# Patient Record
Sex: Female | Born: 1975 | Race: White | Hispanic: No | Marital: Married | Smoking: Former smoker
Health system: Southern US, Community
[De-identification: ages and names within clinical notes are randomized; demographics above are authoritative.]

## PROBLEM LIST (undated history)

## (undated) DIAGNOSIS — I341 Nonrheumatic mitral (valve) prolapse: Secondary | ICD-10-CM

## (undated) DIAGNOSIS — D229 Melanocytic nevi, unspecified: Secondary | ICD-10-CM

## (undated) HISTORY — DX: Nonrheumatic mitral (valve) prolapse: I34.1

## (undated) HISTORY — PX: BUNIONECTOMY: SHX129

---
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
## 2005-04-19 ENCOUNTER — Emergency Department (HOSPITAL_COMMUNITY): Admission: EM | Admit: 2005-04-19 | Discharge: 2005-04-19 | Payer: Self-pay | Admitting: Emergency Medicine

## 2006-02-28 ENCOUNTER — Ambulatory Visit: Payer: Self-pay | Admitting: Internal Medicine

## 2006-02-28 LAB — CONVERTED CEMR LAB
BUN: 13 mg/dL (ref 6–23)
Basophils Absolute: 0 10*3/uL (ref 0.0–0.1)
Basophils Relative: 0 % (ref 0–1)
Creatinine, Ser: 0.84 mg/dL (ref 0.40–1.20)
Eosinophils Relative: 1 % (ref 0–5)
Ferritin: 19 ng/mL (ref 10–291)
HCT: 38.9 % (ref 36.0–46.0)
Iron: 75 ug/dL (ref 42–145)
Lymphs Abs: 3.1 10*3/uL (ref 0.7–3.3)
MCHC: 32.9 g/dL (ref 30.0–36.0)
MCV: 87.8 fL (ref 78.0–100.0)
Platelets: 269 10*3/uL (ref 150–400)
Potassium: 4.2 meq/L (ref 3.5–5.3)
RBC count: 4.43 10*6/uL
RDW: 12.2 % (ref 11.5–14.0)
TSH: 1.784 microintl units/mL (ref 0.350–5.50)
Vitamin B-12: 573 pg/mL (ref 211–911)
WBC: 9.1 10*3/uL (ref 4.0–10.5)

## 2006-03-05 ENCOUNTER — Encounter (INDEPENDENT_AMBULATORY_CARE_PROVIDER_SITE_OTHER): Payer: Self-pay | Admitting: Internal Medicine

## 2006-08-31 ENCOUNTER — Ambulatory Visit: Payer: Self-pay | Admitting: Internal Medicine

## 2006-08-31 DIAGNOSIS — N390 Urinary tract infection, site not specified: Secondary | ICD-10-CM | POA: Insufficient documentation

## 2006-08-31 LAB — CONVERTED CEMR LAB
Nitrite: POSITIVE
Protein, U semiquant: NEGATIVE

## 2007-04-05 ENCOUNTER — Ambulatory Visit: Payer: Self-pay | Admitting: Internal Medicine

## 2007-04-05 DIAGNOSIS — J019 Acute sinusitis, unspecified: Secondary | ICD-10-CM | POA: Insufficient documentation

## 2007-11-11 ENCOUNTER — Encounter (INDEPENDENT_AMBULATORY_CARE_PROVIDER_SITE_OTHER): Payer: Self-pay | Admitting: Family Medicine

## 2008-02-21 DIAGNOSIS — I341 Nonrheumatic mitral (valve) prolapse: Secondary | ICD-10-CM

## 2008-02-21 HISTORY — DX: Nonrheumatic mitral (valve) prolapse: I34.1

## 2008-10-20 ENCOUNTER — Encounter (INDEPENDENT_AMBULATORY_CARE_PROVIDER_SITE_OTHER): Payer: Self-pay | Admitting: Internal Medicine

## 2008-10-21 ENCOUNTER — Ambulatory Visit: Payer: Self-pay | Admitting: Internal Medicine

## 2008-10-21 DIAGNOSIS — R5381 Other malaise: Secondary | ICD-10-CM | POA: Insufficient documentation

## 2008-10-21 DIAGNOSIS — R079 Chest pain, unspecified: Secondary | ICD-10-CM | POA: Insufficient documentation

## 2008-10-21 DIAGNOSIS — R5383 Other fatigue: Secondary | ICD-10-CM

## 2008-10-21 DIAGNOSIS — R002 Palpitations: Secondary | ICD-10-CM | POA: Insufficient documentation

## 2008-10-22 ENCOUNTER — Encounter (INDEPENDENT_AMBULATORY_CARE_PROVIDER_SITE_OTHER): Payer: Self-pay | Admitting: Internal Medicine

## 2008-10-22 ENCOUNTER — Ambulatory Visit: Payer: Self-pay | Admitting: Cardiology

## 2008-10-22 ENCOUNTER — Ambulatory Visit (HOSPITAL_COMMUNITY): Admission: RE | Admit: 2008-10-22 | Discharge: 2008-10-22 | Payer: Self-pay | Admitting: Internal Medicine

## 2008-10-22 LAB — CONVERTED CEMR LAB
Basophils Absolute: 0 10*3/uL (ref 0.0–0.1)
Basophils Relative: 0 % (ref 0–1)
Eosinophils Absolute: 0.1 10*3/uL (ref 0.0–0.7)
Eosinophils Relative: 2 % (ref 0–5)
HCT: 39.4 % (ref 36.0–46.0)
Hemoglobin: 13.2 g/dL (ref 12.0–15.0)
MCHC: 33.5 g/dL (ref 30.0–36.0)
Monocytes Absolute: 0.7 10*3/uL (ref 0.1–1.0)
RDW: 12.5 % (ref 11.5–15.5)

## 2008-11-16 ENCOUNTER — Encounter (INDEPENDENT_AMBULATORY_CARE_PROVIDER_SITE_OTHER): Payer: Self-pay | Admitting: Internal Medicine

## 2009-09-26 ENCOUNTER — Emergency Department (HOSPITAL_COMMUNITY): Admission: EM | Admit: 2009-09-26 | Discharge: 2009-09-26 | Payer: Self-pay | Admitting: Emergency Medicine

## 2011-07-12 ENCOUNTER — Encounter: Payer: Self-pay | Admitting: Gynecology

## 2011-07-12 ENCOUNTER — Ambulatory Visit (INDEPENDENT_AMBULATORY_CARE_PROVIDER_SITE_OTHER): Payer: No Typology Code available for payment source

## 2011-07-12 ENCOUNTER — Other Ambulatory Visit (HOSPITAL_COMMUNITY)
Admission: RE | Admit: 2011-07-12 | Discharge: 2011-07-12 | Disposition: A | Discharge status: Home / Self Care | Payer: No Typology Code available for payment source | Source: Ambulatory Visit | Attending: Gynecology | Admitting: Gynecology

## 2011-07-12 ENCOUNTER — Ambulatory Visit (INDEPENDENT_AMBULATORY_CARE_PROVIDER_SITE_OTHER): Payer: No Typology Code available for payment source | Admitting: Gynecology

## 2011-07-12 VITALS — BP 118/70 | Ht 65.0 in | Wt 130.0 lb

## 2011-07-12 DIAGNOSIS — R102 Pelvic and perineal pain: Secondary | ICD-10-CM

## 2011-07-12 DIAGNOSIS — N831 Corpus luteum cyst of ovary, unspecified side: Secondary | ICD-10-CM

## 2011-07-12 DIAGNOSIS — Z01419 Encounter for gynecological examination (general) (routine) without abnormal findings: Secondary | ICD-10-CM

## 2011-07-12 DIAGNOSIS — N949 Unspecified condition associated with female genital organs and menstrual cycle: Secondary | ICD-10-CM

## 2011-07-12 LAB — CBC WITH DIFFERENTIAL/PLATELET
Basophils Absolute: 0 K/uL (ref 0.0–0.1)
Basophils Relative: 0 % (ref 0–1)
Eosinophils Absolute: 0 K/uL (ref 0.0–0.7)
Eosinophils Relative: 1 % (ref 0–5)
HCT: 39.4 % (ref 36.0–46.0)
Hemoglobin: 13.5 g/dL (ref 12.0–15.0)
Lymphocytes Relative: 31 % (ref 12–46)
Lymphs Abs: 2.1 K/uL (ref 0.7–4.0)
MCH: 29.2 pg (ref 26.0–34.0)
MCHC: 34.3 g/dL (ref 30.0–36.0)
MCV: 85.1 fL (ref 78.0–100.0)
Monocytes Absolute: 0.7 K/uL (ref 0.1–1.0)
Monocytes Relative: 11 % (ref 3–12)
Neutro Abs: 3.9 K/uL (ref 1.7–7.7)
Neutrophils Relative %: 57 % (ref 43–77)
Platelets: 282 K/uL (ref 150–400)
RBC: 4.63 MIL/uL (ref 3.87–5.11)
RDW: 13.7 % (ref 11.5–15.5)
WBC: 6.8 K/uL (ref 4.0–10.5)

## 2011-07-12 NOTE — Patient Instructions (Signed)
Breast Self-Exam A self breast exam may help you find changes or problems while they are still small. Do a breast self-exam:  Every month.   One week after your period (menstrual period).   On the first day of each month if you do not have periods anymore.  Look for any:  Change in breast color, size, or shape.   Dimples in your breast.   Changes in your nipples or skin.   Dry skin on your breasts or nipples.   Watery or bloody discharge from your nipples.   Feel for:  Lumps.   Thick, hard places.   Any other changes.  HOME CARE There are 3 ways to do the breast self-exam: In front of a mirror.  Lift your arms over your head and turn side to side.   Put your hands on your hips and lean down, then turn from side to side.   Bend forward and turn from side to side.  In the shower.  With soapy hands, check both breasts. Then check above and below your collarbone and your armpits.   Feel above and below your collarbone down to under your breast, and from the center of your chest to the outer edge of the armpit. Check for any lumps or hard spots.   Using the tips of your middle three fingers check your whole breast by pressing your hand over your breast in a circle or in an up and down motion.  Lying down.  Lie flat on your bed.   Put a small pillow under the breast you are going to check. On that same side, put your hand behind your head.   With your other hand, use the 3 middle fingers to feel the breast.   Move your fingers in a circle around the breast. Press firmly over all parts of the breast to feel for any lumps.  GET HELP RIGHT AWAY IF: You find any changes in your breasts so they can be checked. Document Released: 07/26/2007 Document Revised: 01/26/2011 Document Reviewed: 05/27/2008 Exit[employer] Patient Information 2012 [employer].  Health Maintenance, Females A healthy lifestyle and preventative care can promote health and wellness.  Maintain regular  health, dental, and eye exams.   Eat a healthy diet. Foods like vegetables, fruits, whole grains, low-fat dairy products, and lean protein foods contain the nutrients you need without too many calories. Decrease your intake of foods high in solid fats, added sugars, and salt. Get information about a proper diet from your caregiver, if necessary.   Regular physical exercise is one of the most important things you can do for your health. Most adults should get at least 150 minutes of moderate-intensity exercise (any activity that increases your heart rate and causes you to sweat) each week. In addition, most adults need muscle-strengthening exercises on 2 or more days a week.    Maintain a healthy weight. The body mass index (BMI) is a screening tool to identify possible weight problems. It provides an estimate of body fat based on height and weight. Your caregiver can help determine your BMI, and can help you achieve or maintain a healthy weight. For adults 20 years and older:   A BMI below 18.5 is considered underweight.   A BMI of 18.5 to 24.9 is normal.   A BMI of 25 to 29.9 is considered overweight.   A BMI of 30 and above is considered obese.   Maintain normal blood lipids and cholesterol by exercising and minimizing your   intake of saturated fat. Eat a balanced diet with plenty of fruits and vegetables. Blood tests for lipids and cholesterol should begin at age 20 and be repeated every 5 years. If your lipid or cholesterol levels are high, you are over 50, or you are a high risk for heart disease, you may need your cholesterol levels checked more frequently.Ongoing high lipid and cholesterol levels should be treated with medicines if diet and exercise are not effective.   If you smoke, find out from your caregiver how to quit. If you do not use tobacco, do not start.   If you are pregnant, do not drink alcohol. If you are breastfeeding, be very cautious about drinking alcohol. If you are  not pregnant and choose to drink alcohol, do not exceed 1 drink per day. One drink is considered to be 12 ounces (355 mL) of beer, 5 ounces (148 mL) of wine, or 1.5 ounces (44 mL) of liquor.   Avoid use of street drugs. Do not share needles with anyone. Ask for help if you need support or instructions about stopping the use of drugs.   High blood pressure causes heart disease and increases the risk of stroke. Blood pressure should be checked at least every 1 to 2 years. Ongoing high blood pressure should be treated with medicines, if weight loss and exercise are not effective.   If you are 55 to 38 years old, ask your caregiver if you should take aspirin to prevent strokes.   Diabetes screening involves taking a blood sample to check your fasting blood sugar level. This should be done once every 3 years, after age 45, if you are within normal weight and without risk factors for diabetes. Testing should be considered at a younger age or be carried out more frequently if you are overweight and have at least 1 risk factor for diabetes.   Breast cancer screening is essential preventative care for women. You should practice "breast self-awareness." This means understanding the normal appearance and feel of your breasts and may include breast self-examination. Any changes detected, no matter how small, should be reported to a caregiver. Women in their 20s and 30s should have a clinical breast exam (CBE) by a caregiver as part of a regular health exam every 1 to 3 years. After age 40, women should have a CBE every year. Starting at age 40, women should consider having a mammogram (breast X-ray) every year. Women who have a family history of breast cancer should talk to their caregiver about genetic screening. Women at a high risk of breast cancer should talk to their caregiver about having an MRI and a mammogram every year.   The Pap test is a screening test for cervical cancer. Women should have a Pap test  starting at age 21. Between ages 21 and 29, Pap tests should be repeated every 2 years. Beginning at age 30, you should have a Pap test every 3 years as long as the past 3 Pap tests have been normal. If you had a hysterectomy for a problem that was not cancer or a condition that could lead to cancer, then you no longer need Pap tests. If you are between ages 65 and 70, and you have had normal Pap tests going back 10 years, you no longer need Pap tests. If you have had past treatment for cervical cancer or a condition that could lead to cancer, you need Pap tests and screening for cancer for at least 20 years after   your treatment. If Pap tests have been discontinued, risk factors (such as a new sexual partner) need to be reassessed to determine if screening should be resumed. Some women have medical problems that increase the chance of getting cervical cancer. In these cases, your caregiver may recommend more frequent screening and Pap tests.   The human papillomavirus (HPV) test is an additional test that may be used for cervical cancer screening. The HPV test looks for the virus that can cause the cell changes on the cervix. The cells collected during the Pap test can be tested for HPV. The HPV test could be used to screen women aged 30 years and older, and should be used in women of any age who have unclear Pap test results. After the age of 30, women should have HPV testing at the same frequency as a Pap test.   Colorectal cancer can be detected and often prevented. Most routine colorectal cancer screening begins at the age of 50 and continues through age 75. However, your caregiver may recommend screening at an earlier age if you have risk factors for colon cancer. On a yearly basis, your caregiver may provide home test kits to check for hidden blood in the stool. Use of a small camera at the end of a tube, to directly examine the colon (sigmoidoscopy or colonoscopy), can detect the earliest forms of  colorectal cancer. Talk to your caregiver about this at age 50, when routine screening begins. Direct examination of the colon should be repeated every 5 to 10 years through age 75, unless early forms of pre-cancerous polyps or small growths are found.   Hepatitis C blood testing is recommended for all people born from 1945 through 1965 and any individual with known risks for hepatitis C.   Practice safe sex. Use condoms and avoid high-risk sexual practices to reduce the spread of sexually transmitted infections (STIs). Sexually active women aged 25 and younger should be checked for Chlamydia, which is a common sexually transmitted infection. Older women with new or multiple partners should also be tested for Chlamydia. Testing for other STIs is recommended if you are sexually active and at increased risk.   Osteoporosis is a disease in which the bones lose minerals and strength with aging. This can result in serious bone fractures. The risk of osteoporosis can be identified using a bone density scan. Women ages 65 and over and women at risk for fractures or osteoporosis should discuss screening with their caregivers. Ask your caregiver whether you should be taking a calcium supplement or vitamin D to reduce the rate of osteoporosis.   Menopause can be associated with physical symptoms and risks. Hormone replacement therapy is available to decrease symptoms and risks. You should talk to your caregiver about whether hormone replacement therapy is right for you.   Use sunscreen with a sun protection factor (SPF) of 30 or greater. Apply sunscreen liberally and repeatedly throughout the day. You should seek shade when your shadow is shorter than you. Protect yourself by wearing long sleeves, pants, a wide-brimmed hat, and sunglasses year round, whenever you are outdoors.   Notify your caregiver of new moles or changes in moles, especially if there is a change in shape or color. Also notify your caregiver if  a mole is larger than the size of a pencil eraser.   Stay current with your immunizations.  Document Released: 08/22/2010 Document Revised: 01/26/2011 Document Reviewed: 08/22/2010 [employer] Patient Information 2012 [employer].   

## 2011-07-12 NOTE — Progress Notes (Signed)
Deanna Calderon Feb 04, 1976 [id number]   History:    36 y.o.  for annual exam with complaint of on and off low pelvic discomfort for the past 3-4 months. Patient states discomfort not associated with meals or with her menstrual cycle. She stated she's having normal menstrual cycles and her husband has had a vasectomy. Her last gynecological examination was over 3 years ago in another facility.  Past medical history,surgical history, family history and social history were all reviewed and documented in the EPIC chart.  Gynecologic History Patient's last menstrual period was 06/17/2011. Contraception: vasectomy Last Pap: Over 3 years ago. Results were: normal Last mammogram: No prior study. Results were: normal  Obstetric History OB History    Grav Para Term Preterm Abortions TAB SAB Ect Mult Living   2 2 2       2      # Outc Date GA Lbr Len/2nd Wgt Sex Del Anes PTL Lv   1 TRM     F SVD  No Yes   2 TRM     M SVD  No Yes       ROS: A ROS was performed and pertinent positives and negatives are included in the history.  GENERAL: No fevers or chills. HEENT: No change in vision, no earache, sore throat or sinus congestion. NECK: No pain or stiffness. CARDIOVASCULAR: No chest pain or pressure. No palpitations. PULMONARY: No shortness of breath, cough or wheeze. GASTROINTESTINAL: No abdominal pain, nausea, vomiting or diarrhea, melena or bright red blood per rectum. GENITOURINARY: No urinary frequency, urgency, hesitancy or dysuria. MUSCULOSKELETAL: No joint or muscle pain, no back pain, no recent trauma. DERMATOLOGIC: No rash, no itching, no lesions. ENDOCRINE: No polyuria, polydipsia, no heat or cold intolerance. No recent change in weight. HEMATOLOGICAL: No anemia or easy bruising or bleeding. NEUROLOGIC: No headache, seizures, numbness, tingling or weakness. PSYCHIATRIC: No depression, no loss of interest in normal activity or change in sleep pattern.     Exam: chaperone present  BP 118/70   Ht 5\' 5"  (1.651 m)  Wt 130 lb (58.968 kg)  BMI 21.63 kg/m2  LMP 06/17/2011  Body mass index is 21.63 kg/(m^2).  General appearance : Well developed well nourished female. No acute distress HEENT: Neck supple, trachea midline, no carotid bruits, no thyroidmegaly Lungs: Clear to auscultation, no rhonchi or wheezes, or rib retractions  Heart: Regular rate and rhythm, no murmurs or gallops Breast:Examined in sitting and supine position were symmetrical in appearance, no palpable masses or tenderness,  no skin retraction, no nipple inversion, no nipple discharge, no skin discoloration, no axillary or supraclavicular lymphadenopathy Abdomen: no palpable masses or tenderness, no rebound or guarding Extremities: no edema or skin discoloration or tenderness  Pelvic:  Bartholin, Urethra, Skene Glands: Within normal limits             Vagina: No gross lesions or discharge  Cervix: No gross lesions or discharge  Uterus  anteverted, normal size, shape and consistency, non-tender and mobile  Adnexa  slight tenderness and questionable fullness on the right adnexa  Anus and perineum  normal   Rectovaginal  normal sphincter tone without palpated masses or tenderness             Hemoccult not done   Ultrasound report today: Uterus measures 9.9 x 6.9 x 4.3 cm with an endometrial stripe of 9.7 mm (patient on day 26 of her cycle) right ovary normal left ovary thick corpus luteum cyst measuring 27 x 23 x 21  mm.  Assessment/Plan:  36 y.o. female for annual exam she was encouraged to do her monthly self breast examination. The following labs were ordered today: CBC, cholesterol, urinalysis and Pap smear. New Pap smear screening guidelines discussed. Patient was reassured with the findings on the ultrasound. If she continues to have low abdominal pains or is affecting her quality of life we'll need to consider doing a laparoscopy or referring her to the gastroenterologist.    Ok Edwards MD, 9:32 AM  07/12/2011

## 2011-07-13 LAB — URINALYSIS W MICROSCOPIC + REFLEX CULTURE
Bilirubin Urine: NEGATIVE
Crystals: NONE SEEN
Glucose, UA: NEGATIVE mg/dL
Specific Gravity, Urine: 1.024 (ref 1.005–1.030)

## 2012-07-31 ENCOUNTER — Other Ambulatory Visit: Payer: Self-pay | Admitting: Dermatology

## 2012-07-31 DIAGNOSIS — D229 Melanocytic nevi, unspecified: Secondary | ICD-10-CM

## 2012-07-31 HISTORY — DX: Melanocytic nevi, unspecified: D22.9

## 2012-12-31 ENCOUNTER — Encounter: Payer: Self-pay | Admitting: Family Medicine

## 2012-12-31 ENCOUNTER — Ambulatory Visit (INDEPENDENT_AMBULATORY_CARE_PROVIDER_SITE_OTHER): Payer: 59 | Admitting: Family Medicine

## 2012-12-31 VITALS — BP 110/60 | HR 88 | Temp 98.2°F | Resp 18 | Ht 64.5 in | Wt 134.5 lb

## 2012-12-31 DIAGNOSIS — F17200 Nicotine dependence, unspecified, uncomplicated: Secondary | ICD-10-CM

## 2012-12-31 DIAGNOSIS — F329 Major depressive disorder, single episode, unspecified: Secondary | ICD-10-CM

## 2012-12-31 MED ORDER — BUPROPION HCL ER (SR) 150 MG PO TB12: 150.0000 mg | ORAL_TABLET | Freq: Two times a day (BID) | ORAL | Status: DC

## 2012-12-31 NOTE — Patient Instructions (Signed)
Restart the the wellbutrin, with 1 tablet daily for 1 week, the increase to 1 tablet twice a week  Work on the smoking!  F/U 3 months

## 2013-01-01 ENCOUNTER — Encounter: Payer: Self-pay | Admitting: Family Medicine

## 2013-01-01 DIAGNOSIS — F329 Major depressive disorder, single episode, unspecified: Secondary | ICD-10-CM | POA: Insufficient documentation

## 2013-01-01 DIAGNOSIS — F17200 Nicotine dependence, unspecified, uncomplicated: Secondary | ICD-10-CM | POA: Insufficient documentation

## 2013-01-01 NOTE — Assessment & Plan Note (Signed)
Hopefully the Wellbutrin will help her with smoking again, she is very interested in tobacco cessation

## 2013-01-01 NOTE — Progress Notes (Signed)
  Subjective:    Patient ID: Deanna Calderon, female    DOB: Aug 03, 1975, 37 y.o.   MRN: [id number]  HPI  Patient here to establish care. She's never had a true PCP. She is followed by GYN Dr. Lily Peer in her Pap smear is up-to-date. She was recently on Wellbutrin 150 mg twice a day secondary to depression and stress. She ran out of her medication about one month ago. Her medication was actually prescribed by a physician whom she was working with it she was also a patient of. The Wellbutrin helped keep her stress levels down and her mood was much improved. She also quit smoking with the medication and over the past month has returned to smoking up to 5 cigarettes a day. She denies any crying episodes and difficulty with sleep. She denies any suicidal ideations or history of. She currently works as an Public house manager Family history was reviewed  Review of Systems  GEN- denies fatigue, fever, weight loss,weakness, recent illness HEENT- denies eye drainage, change in vision, nasal discharge, CVS- denies chest pain, palpitations RESP- denies SOB, cough, wheeze ABD- denies N/V, change in stools, abd pain GU- denies dysuria, hematuria, dribbling, incontinence MSK- denies joint pain, muscle aches, injury Neuro- denies headache, dizziness, syncope, seizure activity      Objective:   Physical Exam GEN- NAD, alert and oriented x3 HEENT- PERRL, EOMI, non injected sclera, pink conjunctiva, MMM, oropharynx clear Neck- Supple, no thyromegaly CVS- RRR, no murmur RESP-CTAB EXT- No edema Pulses- Radial 2+ Psych- normal affect and mood         Assessment & Plan:

## 2013-01-01 NOTE — Assessment & Plan Note (Signed)
Restart Wellbutrin she has done well with this. Will start to 150 once a day for one week and then increase back to the twice a day dosing

## 2013-04-08 ENCOUNTER — Ambulatory Visit: Payer: 59 | Admitting: Family Medicine

## 2013-09-03 ENCOUNTER — Ambulatory Visit (INDEPENDENT_AMBULATORY_CARE_PROVIDER_SITE_OTHER): Payer: 59 | Admitting: Family Medicine

## 2013-09-03 ENCOUNTER — Encounter: Payer: Self-pay | Admitting: Family Medicine

## 2013-09-03 VITALS — BP 124/72 | HR 68 | Temp 97.9°F | Resp 12 | Ht 67.0 in | Wt 135.0 lb

## 2013-09-03 DIAGNOSIS — R5381 Other malaise: Secondary | ICD-10-CM

## 2013-09-03 DIAGNOSIS — Z13220 Encounter for screening for lipoid disorders: Secondary | ICD-10-CM

## 2013-09-03 DIAGNOSIS — R5383 Other fatigue: Secondary | ICD-10-CM

## 2013-09-03 DIAGNOSIS — F17200 Nicotine dependence, unspecified, uncomplicated: Secondary | ICD-10-CM

## 2013-09-03 DIAGNOSIS — Z1321 Encounter for screening for nutritional disorder: Secondary | ICD-10-CM

## 2013-09-03 DIAGNOSIS — F330 Major depressive disorder, recurrent, mild: Secondary | ICD-10-CM

## 2013-09-03 DIAGNOSIS — Z13228 Encounter for screening for other metabolic disorders: Secondary | ICD-10-CM

## 2013-09-03 DIAGNOSIS — Z1329 Encounter for screening for other suspected endocrine disorder: Secondary | ICD-10-CM

## 2013-09-03 DIAGNOSIS — Z0000 Encounter for general adult medical examination without abnormal findings: Secondary | ICD-10-CM

## 2013-09-03 DIAGNOSIS — Z130 Encounter for screening for diseases of the blood and blood-forming organs and certain disorders involving the immune mechanism: Secondary | ICD-10-CM

## 2013-09-03 MED ORDER — FEXOFENADINE HCL 180 MG PO TABS: 180.0000 mg | ORAL_TABLET | Freq: Every day | ORAL | Status: DC

## 2013-09-03 MED ORDER — NICOTINE 7 MG/24HR TD PT24: 7.0000 mg | MEDICATED_PATCH | Freq: Every day | TRANSDERMAL | Status: DC

## 2013-09-03 MED ORDER — BUPROPION HCL ER (XL) 300 MG PO TB24: 300.0000 mg | ORAL_TABLET | Freq: Every day | ORAL | Status: DC

## 2013-09-03 NOTE — Assessment & Plan Note (Signed)
We discussed tobacco cessation we will try NicoDerm 7 mg patch concerned about giving her too much nicotine and she does not smoke that much

## 2013-09-03 NOTE — Progress Notes (Signed)
Patient ID: Deanna Calderon, female   DOB: 1975/11/10, 38 y.o.   MRN: [id number]   Subjective:    Patient ID: Deanna Calderon, female    DOB: Apr 14, 1975, 38 y.o.   MRN: [id number]  Patient presents for F/U  patient here to followup medications. She's history Maj. depressive disorder she is taking Wellbutrin but some nights forgets 2 July to go back to the extended release. She's doing well with the medication. She also continues to smoke she is down to 3 or 4 cigarettes a day but wants to use a nicotine patch to help her.  She is due for her GYN exam she would like to go ahead and get her fasting labs done she's also had some increased fatigue therefore requested a thyroid be checked. She is sleeping fairly well. He is a very busy job and she works as a Marine scientist for an [employer]:  GEN- + fatigue, fever, weight loss,weakness, recent illness HEENT- denies eye drainage, change in vision, nasal discharge, CVS- denies chest pain, palpitations RESP- denies SOB, cough, wheeze ABD- denies N/V, change in stools, abd pain GU- denies dysuria, hematuria, dribbling, incontinence MSK- denies joint pain, muscle aches, injury Neuro- denies headache, dizziness, syncope, seizure activity       Objective:    BP 124/72  Pulse 68  Temp(Src) 97.9 F (36.6 C) (Oral)  Resp 12  Ht 5\' 7"  (1.702 m)  Wt 135 lb (61.236 kg)  BMI 21.14 kg/m2  LMP 08/18/2013 GEN- NAD, alert and oriented x3 HEENT- PERRL, EOMI, non injected sclera, pink conjunctiva, MMM, oropharynx clear Neck- Supple, no thyromegaly CVS- RRR, no murmur RESP-CTAB Psych- Normal affect and mood, well groomed, normal speech EXT- No edema Pulses- Radial 2+        Assessment & Plan:      Problem List Items Addressed This Visit   None    Visit Diagnoses   Other malaise and fatigue    -  Primary    Relevant Orders       TSH    Routine general medical examination at a health care facility        Relevant  Orders       CBC with Differential       Comprehensive metabolic panel    Screening cholesterol level        Relevant Orders       Lipid panel    Encounter for vitamin deficiency screening        Relevant Orders       Vitamin D, 25-hydroxy       Note: This dictation was prepared with Dragon dictation along with smaller phrase technology. Any transcriptional errors that result from this process are unintentional.

## 2013-09-03 NOTE — Assessment & Plan Note (Signed)
I will convert her to 300 mg extended release to help with compliance He is doing well with the Wellbutrin

## 2013-09-03 NOTE — Patient Instructions (Signed)
Wellbutrin XL sent in  Try the tobacco patch  Get the labs done fasting F/U 6 months

## 2013-09-03 NOTE — Assessment & Plan Note (Signed)
Think this is multifactorial with her busy job she also has some underlying depression. I will go ahead and check TSH on her as well as vitamin D with her fasting labs

## 2013-09-04 LAB — CBC WITH DIFFERENTIAL/PLATELET
Basophils Absolute: 0 10*3/uL (ref 0.0–0.1)
Basophils Relative: 0 % (ref 0–1)
EOS ABS: 0.1 10*3/uL (ref 0.0–0.7)
Eosinophils Relative: 1 % (ref 0–5)
HCT: 38.8 % (ref 36.0–46.0)
Hemoglobin: 13.5 g/dL (ref 12.0–15.0)
LYMPHS ABS: 2.3 10*3/uL (ref 0.7–4.0)
LYMPHS PCT: 39 % (ref 12–46)
MCH: 29.8 pg (ref 26.0–34.0)
MCHC: 34.8 g/dL (ref 30.0–36.0)
MCV: 85.7 fL (ref 78.0–100.0)
Monocytes Absolute: 0.6 10*3/uL (ref 0.1–1.0)
Monocytes Relative: 10 % (ref 3–12)
NEUTROS PCT: 50 % (ref 43–77)
Neutro Abs: 2.9 10*3/uL (ref 1.7–7.7)
PLATELETS: 262 10*3/uL (ref 150–400)
RBC: 4.53 MIL/uL (ref 3.87–5.11)
RDW: 12.8 % (ref 11.5–15.5)
WBC: 5.8 10*3/uL (ref 4.0–10.5)

## 2013-09-04 LAB — COMPREHENSIVE METABOLIC PANEL
ALT: 9 U/L (ref 0–35)
AST: 14 U/L (ref 0–37)
Albumin: 4 g/dL (ref 3.5–5.2)
Alkaline Phosphatase: 47 U/L (ref 39–117)
BILIRUBIN TOTAL: 0.6 mg/dL (ref 0.2–1.2)
BUN: 10 mg/dL (ref 6–23)
CHLORIDE: 104 meq/L (ref 96–112)
CO2: 27 meq/L (ref 19–32)
Calcium: 8.9 mg/dL (ref 8.4–10.5)
Creat: 0.78 mg/dL (ref 0.50–1.10)
Glucose, Bld: 86 mg/dL (ref 70–99)
Potassium: 4.2 mEq/L (ref 3.5–5.3)
SODIUM: 139 meq/L (ref 135–145)
TOTAL PROTEIN: 6 g/dL (ref 6.0–8.3)

## 2013-09-04 LAB — TSH: TSH: 2.162 u[IU]/mL (ref 0.350–4.500)

## 2013-09-04 LAB — LIPID PANEL
Cholesterol: 155 mg/dL (ref 0–200)
HDL: 56 mg/dL (ref >39)
LDL Cholesterol: 85 mg/dL (ref 0–99)
Total CHOL/HDL Ratio: 2.8 Ratio
Triglycerides: 71 mg/dL (ref <150)
VLDL: 14 mg/dL (ref 0–40)

## 2013-09-04 LAB — VITAMIN D 25 HYDROXY (VIT D DEFICIENCY, FRACTURES): VIT D 25 HYDROXY: 69 ng/mL (ref 30–89)

## 2013-09-12 ENCOUNTER — Ambulatory Visit (INDEPENDENT_AMBULATORY_CARE_PROVIDER_SITE_OTHER): Payer: 59

## 2013-09-12 ENCOUNTER — Ambulatory Visit (INDEPENDENT_AMBULATORY_CARE_PROVIDER_SITE_OTHER): Payer: 59 | Admitting: Podiatrist

## 2013-09-12 ENCOUNTER — Encounter: Payer: Self-pay | Admitting: Podiatrist

## 2013-09-12 VITALS — BP 138/88 | HR 62 | Resp 17

## 2013-09-12 DIAGNOSIS — M2011 Hallux valgus (acquired), right foot: Secondary | ICD-10-CM

## 2013-09-12 DIAGNOSIS — M21619 Bunion of unspecified foot: Secondary | ICD-10-CM

## 2013-09-12 NOTE — Patient Instructions (Signed)
Bunionectomy A bunionectomy is surgery to remove a bunion. A bunion is an enlargement of the joint at the base of the big toe. It is made up of bone and soft tissue on the inside part of the joint. Over time, a painful lump appears on the inside of the joint. The big toe begins to point inward toward the second toe. New bone growth can occur and a bone spur may form. The pain eventually causes difficulty walking. A bunion usually results from inflammation caused by the irritation of poorly fitting shoes. It often begins later in life. A bunionectomy is performed when nonsurgical treatment no longer works. When surgery is needed, the extent of the procedure will depend on the degree of deformity of the foot. Your surgeon will discuss with you the different procedures and what will work best for you depending on your age and health. LET YOUR CAREGIVER KNOW ABOUT:   Previous problems with anesthetics or medicines used to numb the skin.  Allergies to dyes, iodine, foods, and/or latex.  Medicines taken including herbs, eye drops, prescription medicines (especially medicines used to "thin the blood"), aspirin and other over-the-counter medicines, and steroids (by mouth or as a cream).  History of bleeding or blood problems.  Possibility of pregnancy, if this applies.  History of blood clots in your legs and/or lungs .  Previous surgery.  Other important health problems. RISKS AND COMPLICATIONS   Infection.  Pain.  Nerve damage.  Possibility that the bunion will recur. BEFORE THE PROCEDURE  You should be present 60 minutes prior to your procedure or as directed.  PROCEDURE  Surgery is often done so that you can go home the same day (outpatient). It may be done in a hospital or in an outpatient surgical center. An anesthetic will be used to help you sleep during the procedure. Sometimes, a spinal anesthetic is used to make you numb below the waist. A cut (incision) is made over the swollen  area at the first joint of the big toe. The enlarged lump will be removed. If there is a need to reposition the bones of the big toe, this may require more than 1 incision. The bone itself may need to be cut. Screws and wires may be used in the repair. These can be removed at a later date. In severe cases, the entire joint may need to be removed and a joint replacement inserted. When done, the incision is closed with stitches (sutures). Skin adhesive strips may be added for reinforcement. They help hold the incision closed.  AFTER THE PROCEDURE  Compression bandages (dressings) are then wrapped around the wound. This helps to keep the foot in alignment and reduce swelling. Your foot will be monitored for bleeding and swelling. You will need to stay for a few hours in the recovery area before being discharged. This allows time for the anesthesia to wear off. You will be discharged home when you are awake, stable, and doing well. HOME CARE INSTRUCTIONS   You can expect to return to normal activities within 6 to 8 weeks after surgery. The foot is at increased risk for swelling for several months. When you can expect to bear weight on the operated foot will depend on the extent of your surgery. The milder the deformity, the less tissue is removed and the sooner the return to normal activity level. During the recovery period, a special shoe, boot, or cast may be worn to accommodate the surgical bandage and to help provide stability   to the foot.  Once you are home, an ice pack applied to the operative site may help with discomfort and keep swelling down. Stop using the ice if it causes discomfort.  Keep your feet raised (elevated) when possible to lessen swelling.  If you have an elastic bandage on your foot and you have numbness, tingling, or your foot becomes cold and blue, adjust the bandage to make it comfortable.  Change dressings as directed.  Keep the wound dry and clean. The wound may be washed  gently with soap and water. Gently blot dry without rubbing. Do not take baths or use swimming pools or hot tubs for 10 days, or as instructed by your caregiver.  Only take over-the-counter or prescription medicines for pain, discomfort, or fever as directed by your caregiver.  You may continue a normal diet as directed.  For activity, use crutches with no weight bearing or your orthopedic shoe as directed. Continue to use crutches or a cane as directed until you can stand without causing pain. SEEK MEDICAL CARE IF:   You have redness, swelling, bruising, or increasing pain in the wound.  There is pus coming from the wound.  You have drainage from a wound lasting longer than 1 day.  You have an oral temperature above 102 F (38.9 C).  You notice a bad smell coming from the wound or dressing.  The wound breaks open after sutures have been removed.  You develop dizzy episodes or fainting while standing.  You have persistent nausea or vomiting.  Your toes become cold.  Pain is not relieved with medicines. SEEK IMMEDIATE MEDICAL CARE IF:   You develop a rash.  You have difficulty breathing.  You develop any reaction or side effects to medicines given.  Your toes are numb or blue, or you have severe pain. MAKE SURE YOU:   Understand these instructions.  Will watch your condition.  Will get help right away if you are not doing well or get worse. Document Released: 01/20/2005 Document Revised: 05/01/2011 Document Reviewed: 02/25/2007 ExitCare Patient Information 2015 [employer]. This information is not intended to replace advice given to you by your health care provider. Make sure you discuss any questions you have with your health care provider.  

## 2013-09-12 NOTE — Progress Notes (Signed)
   Subjective:    Patient ID: Deanna Calderon, female    DOB: 11-06-1975, 38 y.o.   MRN: [id number]  HPI Pt presents with right foot pain, states that she thinks she has a bunion. States that foot is very painful, worsens with walking, hurting for 2 years but has worsens.   Review of Systems     Objective:   Physical Exam Patient is awake, alert, and oriented x 3.  In no acute distress.  Vascular status is intact with palpable pedal pulses at 2/4 DP and PT bilateral and capillary refill time within normal limits. Neurological sensation is also intact bilaterally via Semmes Weinstein monofilament at 5/5 sites. Light touch, vibratory sensation, Achilles tendon reflex is intact. Dermatological exam reveals skin color, turger and texture as normal. No open lesions present.  Musculature intact with dorsiflexion, plantarflexion, inversion, eversion.  Moderate bunion is present right foot-- it is painful when running as it rubs against her 2nd toe.  On x-ray shortening first metatarsal is noted in comparison with the second an elongated second toe is also seen. Bunion deformity is present with a moderate medial eminence seen on x-ray.    Assessment & Plan:  Bunion right foot  Plan:Discussed conservative versus surgical options. Recommended a austin bunion repair right foot with screw fixation. The consent form was discussed and all three pages were signed and the patient's questions were encouraged and answered to the best of my ability. Risks of the surgery were discussed including but not limited to continued pain, infection, swelling, elevated toe, decreased range of motion,  suture or implant reaction, bleeding, decreased function, etc. Preoperative instructions were also dispensed to the patient as well as a preoperative surgical pamphlet to go along with the instructions. Surgery will be scheduled at the patients convenience and patient will be seen at [facility] specialty surgery center on  outpatient basis.The patient is instructed to call if any questions or concerns arise.

## 2013-10-20 ENCOUNTER — Telehealth: Payer: Self-pay | Admitting: *Deleted

## 2013-10-20 ENCOUNTER — Encounter: Payer: Self-pay | Admitting: Podiatrist

## 2013-10-20 NOTE — Telephone Encounter (Signed)
I was supposed to call back to give a possible date to have surgery with Dr. Valentina Lucks.  Give me a call back.  I returned her call.  She asked for surgery to be done on 02/06/2014.  I told her that date is not good.  Dr. Valentina Lucks does surgery on Wednesdays.  She asked if we could do it on 01/28/2014.  I told her that is fine.  I asked if she had signed consent forms.  She stated no, I have not.  I asked if she wanted me to send her to a scheduler to schedule a consult.  She stated she would call back because she was seeing patients.

## 2013-11-05 ENCOUNTER — Ambulatory Visit: Payer: 59

## 2013-12-01 ENCOUNTER — Encounter: Payer: Self-pay | Admitting: Physician Assistant

## 2013-12-01 ENCOUNTER — Ambulatory Visit (INDEPENDENT_AMBULATORY_CARE_PROVIDER_SITE_OTHER): Payer: 59 | Admitting: Physician Assistant

## 2013-12-01 VITALS — BP 112/66 | HR 68 | Temp 98.1°F | Resp 18 | Wt 138.0 lb

## 2013-12-01 DIAGNOSIS — B9689 Other specified bacterial agents as the cause of diseases classified elsewhere: Principal | ICD-10-CM

## 2013-12-01 DIAGNOSIS — J988 Other specified respiratory disorders: Secondary | ICD-10-CM

## 2013-12-01 MED ORDER — AMOXICILLIN-POT CLAVULANATE 875-125 MG PO TABS: 1.0000 | ORAL_TABLET | Freq: Two times a day (BID) | ORAL | Status: DC

## 2013-12-01 NOTE — Progress Notes (Signed)
    Patient ID: Deanna Calderon MRN: [id number], DOB: 1975-09-28, 38 y.o. Date of Encounter: 12/01/2013, 12:38 PM    Chief Complaint:  Chief Complaint  Patient presents with  . sick x 3 weeks    stuffy nose, dizzy, ear pain, sinuses very congested, cough  was seen at Urgent Care just gave her steroids     HPI: 38 y.o. 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 Says she went to an urgent care about 8 days ago. They gave her Depo-Medrol Zyrtec and nasal spray. Did not prescribe any antibiotics. Patient states that both her husband and friend have had the same illness. Patient is still sick with a lot of sinus pressure and congestion and postnasal drip.     Home Meds:   Outpatient Prescriptions Prior to Visit  Medication Sig Dispense Refill  . buPROPion (WELLBUTRIN XL) 300 MG 24 hr tablet Take 1 tablet (300 mg total) by mouth daily.  30 tablet  11  . Multiple Vitamin (MULTIVITAMIN) tablet Take 1 tablet by mouth daily.      . fexofenadine (ALLEGRA) 180 MG tablet Take 1 tablet (180 mg total) by mouth daily.  30 tablet  11  . nicotine (NICODERM CQ - DOSED IN MG/24 HR) 7 mg/24hr patch Place 1 patch (7 mg total) onto the skin daily.  28 patch  1   No facility-administered medications prior to visit.    Allergies: No Known Allergies    Review of Systems: See HPI for pertinent ROS. All other ROS negative.    Physical Exam: Blood pressure 112/66, pulse 68, temperature 98.1 F (36.7 C), temperature source Oral, resp. rate 18, weight 138 lb (62.596 kg)., Body mass index is 21.61 kg/(m^2). General: WNWD WF.  Appears in no acute distress. HEENT: Normocephalic, atraumatic, eyes without discharge, sclera non-icteric, nares are without discharge. Bilateral auditory canals clear, TM's are without perforation, pearly grey and translucent with reflective cone of light bilaterally. Oral cavity moist, posterior pharynx without  exudate, erythema, peritonsillar abscess. Positive tenderness with percussion of bilateral maxillary and frontal sinuses.  Neck: Supple. No thyromegaly. No lymphadenopathy. Lungs: Clear bilaterally to auscultation without wheezes, rales, or rhonchi. Breathing is unlabored. Heart: Regular rhythm. No murmurs, rubs, or gallops. Msk:  Strength and tone normal for age. Extremities/Skin: Warm and dry.  No rashes . Neuro: Alert and oriented X 3. Moves all extremities spontaneously. Gait is normal. CNII-XII grossly in tact. Psych:  Responds to questions appropriately with a normal affect.     ASSESSMENT AND PLAN:  38 y.o. year old female with  1. Bacterial respiratory infection - amoxicillin-clavulanate (AUGMENTIN) 875-125 MG per tablet; Take 1 tablet by mouth 2 (two) times daily.  Dispense: 20 tablet; Refill: 0 Continue over-the-counter decongestants as needed for symptom relief. Complete all of antibiotic. Follow up if symptoms do not resolve after completion of antibiotic.  [address] [place], [place], [facility] 12/01/2013 12:38 PM

## 2013-12-22 ENCOUNTER — Encounter: Payer: Self-pay | Admitting: Physician Assistant

## 2014-01-06 ENCOUNTER — Ambulatory Visit: Payer: 59 | Admitting: Family Medicine

## 2014-01-13 ENCOUNTER — Encounter: Payer: 59 | Admitting: Gynecology

## 2014-01-23 ENCOUNTER — Telehealth: Payer: Self-pay | Admitting: *Deleted

## 2014-01-23 NOTE — Telephone Encounter (Signed)
Pt asked what type of anesthesia she would receive on 01/28/2014 for foot surgery.  I told pt she would have a IV and it would be like a sleeping pill and she would be breathing on her own.

## 2014-01-28 DIAGNOSIS — M2011 Hallux valgus (acquired), right foot: Secondary | ICD-10-CM

## 2014-01-30 ENCOUNTER — Telehealth: Payer: Self-pay

## 2014-01-30 NOTE — Telephone Encounter (Signed)
Pt called c/o foot pain and toes tingleing. Advised her to take coban wrap off and lighly re dress with ace wrap and continue to elevate. Call if symptoms persist

## 2014-02-04 ENCOUNTER — Encounter: Payer: Self-pay | Admitting: Podiatrist

## 2014-02-04 ENCOUNTER — Ambulatory Visit (INDEPENDENT_AMBULATORY_CARE_PROVIDER_SITE_OTHER): Payer: 59

## 2014-02-04 ENCOUNTER — Ambulatory Visit (INDEPENDENT_AMBULATORY_CARE_PROVIDER_SITE_OTHER): Payer: 59 | Admitting: Podiatrist

## 2014-02-04 VITALS — BP 133/86 | HR 71 | Resp 12

## 2014-02-04 DIAGNOSIS — M2011 Hallux valgus (acquired), right foot: Secondary | ICD-10-CM

## 2014-02-04 DIAGNOSIS — Z9889 Other specified postprocedural states: Secondary | ICD-10-CM

## 2014-02-04 MED ORDER — OXYCODONE-ACETAMINOPHEN 5-325 MG PO TABS: 1.0000 | ORAL_TABLET | ORAL | Status: DC | PRN

## 2014-02-04 NOTE — Progress Notes (Deleted)
   Subjective:    Patient ID: Deanna Calderon, female    DOB: [birth year], 38 y.o.   MRN: [id number]  HPI Comments: DOS 01/28/2014 right austin bunionectomy with screw     Review of Systems     Objective:   Physical Exam        Assessment & Plan:  Darco, remove suture ends, back in 2 weeks

## 2014-02-09 NOTE — Progress Notes (Signed)
Subjective: Patient presents today1 week status post foot surgery of the right foot.  Date of surgery 12/9/15Liane Comber bunion correction with screw fixation. Patient denies nausea, vomiting, fevers, chills or night sweats.  Denies calf pain or tenderness to the operative side.  Objective:  Neurovascular status is intact with palpable pedal pulses DP and PT bilateral at 2+ out of 4. Neurological sensation is intact and unchanged as per prior to surgery. Excellent appearance of the postoperative foot is noted. Incision site is well coapted with sutures in place. X-rays reveal a well-healing surgical foot  Assessment: Status post Liane Comber bunion correction right foot date of surgery 01/28/2014  Plan:  Redressed the foot and a dry and sterile compressive dressing. Recommended continued use of her boot. Suture ends are removed at today's visit and she'll be seen back in 3 weeks for follow-up. She will also start range of motion exercises of the great toe joint.

## 2014-02-11 ENCOUNTER — Telehealth: Payer: Self-pay | Admitting: *Deleted

## 2014-02-11 NOTE — Telephone Encounter (Signed)
Pt states she has a clear stitch left in her surgery foot, is that okay?  Pt states she didn't know she needed to carry the pain medication rx from her last appt.  I told pt the rx was here for her to pick-up and the clear stitch was okay to leave in and could pu a tiny bit of neosporin on the area and cover with a bandaid.

## 2014-02-25 ENCOUNTER — Ambulatory Visit (INDEPENDENT_AMBULATORY_CARE_PROVIDER_SITE_OTHER): Payer: 59

## 2014-02-25 ENCOUNTER — Ambulatory Visit (INDEPENDENT_AMBULATORY_CARE_PROVIDER_SITE_OTHER): Payer: 59 | Admitting: Podiatrist

## 2014-02-25 ENCOUNTER — Encounter: Payer: Self-pay | Admitting: Podiatrist

## 2014-02-25 VITALS — BP 71/54 | HR 92 | Resp 11

## 2014-02-25 DIAGNOSIS — Z9889 Other specified postprocedural states: Secondary | ICD-10-CM

## 2014-02-25 DIAGNOSIS — M21611 Bunion of right foot: Secondary | ICD-10-CM

## 2014-02-25 DIAGNOSIS — M2011 Hallux valgus (acquired), right foot: Secondary | ICD-10-CM

## 2014-02-25 NOTE — Progress Notes (Deleted)
   Subjective:    Patient ID: Deanna Calderon, female    DOB: 01-Feb-1976, 39 y.o.   MRN: [id number]  HPI Comments: DOS 01/28/2014 right austin bunionectomy.     Review of Systems     Objective:   Physical Exam        Assessment & Plan:

## 2014-02-25 NOTE — Progress Notes (Signed)
Subjective: Patient presents today 1 month status post foot surgery of the right foot.  Date of surgery 12/9/15Liane Comber bunion correction with screw fixation. Patient denies nausea, vomiting, fevers, chills or night sweats.  Denies calf pain or tenderness to the operative side. She does state that she continues to have some discomfort especially on the anterior ankle and that she's been wearing her boot at work.  Objective:  Neurovascular status is intact with palpable pedal pulses DP and PT bilateral at 2+ out of 4. Neurological sensation is intact and unchanged as per prior to surgery. Excellent appearance of the postoperative foot is noted. Incision site is well healed. X-rays reveal a healing osteotomy. No shifting of fragment is present. Screw fixation is in place.   Assessment: Status post Liane Comber bunion correction right foot date of surgery 01/28/2014  Plan:  Recommended continued range of motion exercises. Also recommended getting into a supportive athletic shoe as she is unable to tolerate the Darco shoe well. In the future she may require physical therapy to help with the range of motion of this joint. Patient demonstrates an understanding of this conversation should be seen back in 4 weeks for recheck.

## 2014-03-03 ENCOUNTER — Encounter: Payer: Self-pay | Admitting: Gynecology

## 2014-03-03 ENCOUNTER — Ambulatory Visit (INDEPENDENT_AMBULATORY_CARE_PROVIDER_SITE_OTHER): Payer: 59 | Admitting: Gynecology

## 2014-03-03 VITALS — Ht 65.0 in | Wt 142.0 lb

## 2014-03-03 DIAGNOSIS — Z01419 Encounter for gynecological examination (general) (routine) without abnormal findings: Secondary | ICD-10-CM

## 2014-03-03 NOTE — Progress Notes (Signed)
Deanna Calderon 12-Mar-1975 [id number]   History:    39 y.o.  for annual gyn exam with no complaints. Review of patient's record indicated she was last seen the office in 2013. Patient's having normal menstrual cycle her husband has had a vasectomy. Patient with no past history of any abnormal Pap smear. Patient's flu vaccine is up-to-date. Dr. Buelah Manis is been doing her blood work.  Past medical history,surgical history, family history and social history were all reviewed and documented in the EPIC chart.  Gynecologic History Patient's last menstrual period was 02/19/2014. Contraception: vasectomy Last Pap: May 2013. Results were: normal Last mammogram: No prior study. Results were: No prior study  Obstetric History OB History  Gravida Para Term Preterm AB SAB TAB Ectopic Multiple Living  2 2 2       2     # Outcome Date GA Lbr Len/2nd Weight Sex Delivery Anes PTL Lv  2 Term     M Vag-Spont  N Y  1 Term     F Vag-Spont  N Y       ROS: A ROS was performed and pertinent positives and negatives are included in the history.  GENERAL: No fevers or chills. HEENT: No change in vision, no earache, sore throat or sinus congestion. NECK: No pain or stiffness. CARDIOVASCULAR: No chest pain or pressure. No palpitations. PULMONARY: No shortness of breath, cough or wheeze. GASTROINTESTINAL: No abdominal pain, nausea, vomiting or diarrhea, melena or bright red blood per rectum. GENITOURINARY: No urinary frequency, urgency, hesitancy or dysuria. MUSCULOSKELETAL: No joint or muscle pain, no back pain, no recent trauma. DERMATOLOGIC: No rash, no itching, no lesions. ENDOCRINE: No polyuria, polydipsia, no heat or cold intolerance. No recent change in weight. HEMATOLOGICAL: No anemia or easy bruising or bleeding. NEUROLOGIC: No headache, seizures, numbness, tingling or weakness. PSYCHIATRIC: No depression, no loss of interest in normal activity or change in sleep pattern.     Exam: chaperone  present  Ht 5\' 5"  (1.651 m)  Wt 142 lb (64.411 kg)  BMI 23.63 kg/m2  LMP 02/19/2014  Body mass index is 23.63 kg/(m^2).  General appearance : Well developed well nourished female. No acute distress HEENT: Neck supple, trachea midline, no carotid bruits, no thyroidmegaly Lungs: Clear to auscultation, no rhonchi or wheezes, or rib retractions  Heart: Regular rate and rhythm, no murmurs or gallops Breast:Examined in sitting and supine position were symmetrical in appearance, no palpable masses or tenderness,  no skin retraction, no nipple inversion, no nipple discharge, no skin discoloration, no axillary or supraclavicular lymphadenopathy Abdomen: no palpable masses or tenderness, no rebound or guarding Extremities: no edema or skin discoloration or tenderness  Pelvic:  Bartholin, Urethra, Skene Glands: Within normal limits             Vagina: No gross lesions or discharge  Cervix: No gross lesions or discharge  Uterus  anteverted, normal size, shape and consistency, non-tender and mobile  Adnexa  Without masses or tenderness  Anus and perineum  normal   Rectovaginal  normal sphincter tone without palpated masses or tenderness             Hemoccult not indicated     Assessment/Plan:  39 y.o. female for annual exam doing well normal menstrual cycle. PCP will be doing her blood work. Patient was reminded on the importance of monthly breast exam. We discussed importance of calcium vitamin D and regular exercise for osteoporosis prevention patient had a Pap smear approximate 2 years.  Next year she will need a Pap smear. Flu vaccine up-to-date. Requisition to schedule mammogram provided.   Terrance Mass MD, 2:37 PM 03/03/2014

## 2014-03-06 ENCOUNTER — Ambulatory Visit: Payer: 59 | Admitting: Family Medicine

## 2014-03-23 ENCOUNTER — Ambulatory Visit (INDEPENDENT_AMBULATORY_CARE_PROVIDER_SITE_OTHER): Payer: 59 | Admitting: Family Medicine

## 2014-03-23 ENCOUNTER — Encounter: Payer: Self-pay | Admitting: Family Medicine

## 2014-03-23 VITALS — BP 118/68 | HR 64 | Temp 98.3°F | Resp 12 | Ht 65.0 in | Wt 140.0 lb

## 2014-03-23 DIAGNOSIS — F330 Major depressive disorder, recurrent, mild: Secondary | ICD-10-CM

## 2014-03-23 DIAGNOSIS — F17200 Nicotine dependence, unspecified, uncomplicated: Secondary | ICD-10-CM

## 2014-03-23 DIAGNOSIS — Z720 Tobacco use: Secondary | ICD-10-CM

## 2014-03-23 MED ORDER — VARENICLINE TARTRATE 0.5 MG X 11 & 1 MG X 42 PO MISC: ORAL | Status: DC

## 2014-03-23 MED ORDER — BUPROPION HCL ER (XL) 300 MG PO TB24: 300.0000 mg | ORAL_TABLET | Freq: Every day | ORAL | Status: DC

## 2014-03-23 NOTE — Progress Notes (Signed)
Patient ID: Deanna Calderon, female   DOB: August 06, 1975, 39 y.o.   MRN: [id number]   Subjective:    Patient ID: Deanna Calderon, female    DOB: 28-Nov-1975, 39 y.o.   MRN: [id number]  Patient presents for 6 month F/U  patient here to follow-up medications. She is doing very well with the Wellbutrin is helping control her depression and her mood. She would like to try Shanti 6 to help her quit smoking Wellbutrin has not helped she's tried nicotine patches and these have not helped.    Review Of Systems:  GEN- denies fatigue, fever, weight loss,weakness, recent illness HEENT- denies eye drainage, change in vision, nasal discharge, CVS- denies chest pain, palpitations RESP- denies SOB, cough, wheeze ABD- denies N/V, change in stools, abd pain GU- denies dysuria, hematuria, dribbling, incontinence MSK- denies joint pain, muscle aches, injury Neuro- denies headache, dizziness, syncope, seizure activity       Objective:    BP 118/68 mmHg  Pulse 64  Temp(Src) 98.3 F (36.8 C) (Oral)  Resp 12  Ht 5\' 5"  (1.651 m)  Wt 140 lb (63.504 kg)  BMI 23.30 kg/m2  LMP 03/16/2014 GEN- NAD, alert and oriented x3 HEENT- PERRL, EOMI, non injected sclera, pink conjunctiva, MMM, oropharynx clear CVS- RRR, no murmur RESP-CTAB Psych- normal affect and mood         Assessment & Plan:      Problem List Items Addressed This Visit      Unprioritized   Tobacco use disorder - Primary   Relevant Medications   varenicline (CHANTIX STARTING MONTH PAK) 0.5 MG X 11 & 1 MG X 42 tablet   MDD (major depressive disorder)   Relevant Medications   buPROPion (WELLBUTRIN XL) 24 hr tablet      Note: This dictation was prepared with Dragon dictation along with smaller phrase technology. Any transcriptional errors that result from this process are unintentional.

## 2014-03-23 NOTE — Assessment & Plan Note (Signed)
Trial of chantix, stable on wellbutrin, discussed SE

## 2014-03-23 NOTE — Patient Instructions (Signed)
Continue current meds Try the Chantix, quit date in 2 weeks F/U 6 months

## 2014-03-23 NOTE — Assessment & Plan Note (Signed)
Recent fasting labs done all normal, no labs needed today Wellbutrin is doing well for her, no change to meds

## 2014-03-25 ENCOUNTER — Ambulatory Visit (INDEPENDENT_AMBULATORY_CARE_PROVIDER_SITE_OTHER): Payer: 59 | Admitting: Podiatrist

## 2014-03-25 ENCOUNTER — Ambulatory Visit (INDEPENDENT_AMBULATORY_CARE_PROVIDER_SITE_OTHER): Payer: 59

## 2014-03-25 ENCOUNTER — Encounter: Payer: Self-pay | Admitting: Podiatrist

## 2014-03-25 VITALS — BP 149/89 | HR 66 | Resp 16

## 2014-03-25 DIAGNOSIS — M21611 Bunion of right foot: Secondary | ICD-10-CM

## 2014-03-25 DIAGNOSIS — Z9889 Other specified postprocedural states: Secondary | ICD-10-CM

## 2014-03-25 DIAGNOSIS — M2011 Hallux valgus (acquired), right foot: Secondary | ICD-10-CM

## 2014-04-12 NOTE — Progress Notes (Signed)
Chief Complaint  Patient presents with  . Routine Post Op    DOS 01-29-2015 POV Austin bunionectomy right foot   "A little swollen today"      Subjective: Patient presents today status post foot surgery of the right foot.  Date of surgery 12/9/15Liane Comber bunion correction with screw fixation. She relates it is doing well- a little swollen from being on it at work all day.    Objective:  Neurovascular status is intact with palpable pedal pulses DP and PT bilateral at 2+ out of 4. Neurological sensation is intact and unchanged as per prior to surgery. Excellent appearance of the postoperative foot is noted. Incision site is well healed. X-rays reveal a healing osteotomy.  Screw fixation is in place.   Assessment: Status post Liane Comber bunion correction right foot date of surgery 01/28/2014  Plan:  Recommended continued range of motion exercises. Recommended continuation of athletic shoes and good supportive shoegear at work.  She will be seen again in 4 weeks for recheck and will call if any concerns arise.

## 2014-05-06 ENCOUNTER — Encounter: Payer: Self-pay | Admitting: Podiatrist

## 2014-05-06 ENCOUNTER — Ambulatory Visit (INDEPENDENT_AMBULATORY_CARE_PROVIDER_SITE_OTHER): Payer: 59

## 2014-05-06 ENCOUNTER — Ambulatory Visit (INDEPENDENT_AMBULATORY_CARE_PROVIDER_SITE_OTHER): Payer: 59 | Admitting: Podiatrist

## 2014-05-06 VITALS — BP 125/79 | HR 69 | Resp 12

## 2014-05-06 DIAGNOSIS — M2011 Hallux valgus (acquired), right foot: Secondary | ICD-10-CM

## 2014-05-21 NOTE — Progress Notes (Signed)
Chief Complaint  Patient presents with  . Routine Post Op    DOS 01-28-2014 POV Austin bunionectomy right foot. ''RT FOOT IS DOING OK BUT STILL HAVE NUMBNESS FEELING.''      Subjective: Patient presents today status post foot surgery of the right foot.  Date of surgery 12/9/15[name] bunion correction with screw fixation. She relates it is doing well-  The swelling has decreased but she still has numbness right along the incision site.  Objective:  Neurovascular status is intact with palpable pedal pulses DP and PT bilateral at 2+ out of 4. Neurological sensation is intact and unchanged as per prior to surgery. Numbness along the incision site itself and at the first mpj is noted to be present but improving.  Excellent appearance of the postoperative foot is noted. Incision site is well healed. X-rays reveal a healed osteotomy with Screw fixation in place.   Assessment: Status post Deanna Calderon bunion correction right foot date of surgery 01/28/2014  Plan:  Recommended continued range of motion exercises. She may return to any activities as tolerated. She  Is discharged from her post operative course. If any concerns arise in the future she will call.

## 2014-08-21 ENCOUNTER — Encounter: Payer: Self-pay | Admitting: Family Medicine

## 2014-08-21 ENCOUNTER — Ambulatory Visit (INDEPENDENT_AMBULATORY_CARE_PROVIDER_SITE_OTHER): Payer: 59 | Admitting: Family Medicine

## 2014-08-21 VITALS — BP 130/82 | HR 74 | Temp 98.6°F | Resp 16 | Wt 138.0 lb

## 2014-08-21 DIAGNOSIS — O926 Galactorrhea: Secondary | ICD-10-CM

## 2014-08-21 DIAGNOSIS — G4489 Other headache syndrome: Secondary | ICD-10-CM | POA: Diagnosis not present

## 2014-08-21 DIAGNOSIS — N643 Galactorrhea not associated with childbirth: Secondary | ICD-10-CM

## 2014-08-21 LAB — CBC WITH DIFFERENTIAL/PLATELET
BASOS PCT: 0 % (ref 0–1)
Basophils Absolute: 0 10*3/uL (ref 0.0–0.1)
Eosinophils Absolute: 0.1 10*3/uL (ref 0.0–0.7)
Eosinophils Relative: 1 % (ref 0–5)
HCT: 40.9 % (ref 36.0–46.0)
Hemoglobin: 13.8 g/dL (ref 12.0–15.0)
LYMPHS ABS: 2.7 10*3/uL (ref 0.7–4.0)
LYMPHS PCT: 42 % (ref 12–46)
MCH: 29.8 pg (ref 26.0–34.0)
MCHC: 33.7 g/dL (ref 30.0–36.0)
MCV: 88.3 fL (ref 78.0–100.0)
MPV: 10.3 fL (ref 8.6–12.4)
Monocytes Absolute: 0.6 10*3/uL (ref 0.1–1.0)
Monocytes Relative: 9 % (ref 3–12)
Neutro Abs: 3.1 10*3/uL (ref 1.7–7.7)
Neutrophils Relative %: 48 % (ref 43–77)
PLATELETS: 253 10*3/uL (ref 150–400)
RBC: 4.63 MIL/uL (ref 3.87–5.11)
RDW: 12.9 % (ref 11.5–15.5)
WBC: 6.4 10*3/uL (ref 4.0–10.5)

## 2014-08-21 LAB — COMPREHENSIVE METABOLIC PANEL
ALK PHOS: 49 U/L (ref 39–117)
ALT: 8 U/L (ref 0–35)
AST: 14 U/L (ref 0–37)
Albumin: 4.1 g/dL (ref 3.5–5.2)
BUN: 10 mg/dL (ref 6–23)
CALCIUM: 8.7 mg/dL (ref 8.4–10.5)
CO2: 24 meq/L (ref 19–32)
Chloride: 103 mEq/L (ref 96–112)
Creat: 0.8 mg/dL (ref 0.50–1.10)
GLUCOSE: 73 mg/dL (ref 70–99)
Potassium: 3.7 mEq/L (ref 3.5–5.3)
Sodium: 139 mEq/L (ref 135–145)
Total Bilirubin: 0.6 mg/dL (ref 0.2–1.2)
Total Protein: 6.5 g/dL (ref 6.0–8.3)

## 2014-08-21 LAB — TSH: TSH: 2.114 u[IU]/mL (ref 0.350–4.500)

## 2014-08-21 LAB — PREGNANCY, URINE: Preg Test, Ur: NEGATIVE

## 2014-08-21 NOTE — Progress Notes (Signed)
Patient ID: Deanna Calderon, female   DOB: [birth year], 39 y.o.   MRN: [id number]   Subjective:    Patient ID: Deanna Calderon, female    DOB: Jul 27, 1975, 39 y.o.   MRN: [id number]  Patient presents for Pain/pressure behind eyes and Breast have been producing milk  patient here because of headaches daily for the past couple months. She feels a pressure behind both of her eyes. She went had her eyes evaluated by her ophthalmologist her exam was normal she was advised to come to my office for further evaluation. She states it is not very severe but she notices the headache she has tried changing her computer she has tried working under a different lighting none of this has helped. She is not having problems with her blood sugar her blood pressure. She has noticed that she's had some milky drainage from both nipples for the past couple weeks. Her last menstrual. Was 2 weeks ago. She is only taking Wellbutrin on a regular basis. She does not have any nausea vomiting or change in her vision,dizziness associated with any of the symptoms above    Review Of Systems:  GEN- denies fatigue, fever, weight loss,weakness, recent illness HEENT- denies eye drainage, change in vision, nasal discharge, CVS- denies chest pain, palpitations RESP- denies SOB, cough, wheeze ABD- denies N/V, change in stools, abd pain GU- denies dysuria, hematuria, dribbling, incontinence MSK- denies joint pain, muscle aches, injury Neuro- + headache, dizziness, syncope, seizure activity       Objective:    BP 130/82 mmHg  Pulse 74  Temp(Src) 98.6 F (37 C) (Oral)  Resp 16  Wt 138 lb (62.596 kg) GEN- NAD, alert and oriented x3 HEENT- PERRL, EOMI, non injected sclera, pink conjunctiva, MMM, oropharynx clear Neck- Supple, no thyromegaly CVS- RRR, no murmur, occasional PVC RESP-CTAB Breast- normal symmetry, no nipple inversion,no nipple drainage, no nodules or lumps felt Nodes- no axillary nodes Neuro-CNII-XII intact,  no papilledema Pulses- Radial  2+        Assessment & Plan:      Problem List Items Addressed This Visit    None    Visit Diagnoses    Galactorrhea    -  Primary    unable to express any subtance from breast, normal breast exam, check prolactin level, no meds that can cause this on board    Relevant Orders    CBC with Differential/Platelet (Completed)    Comprehensive metabolic panel (Completed)    TSH (Completed)    Prolactin (Completed)    Pregnancy, urine (Completed)    Other headache syndrome        ? migraine variant, discussed imaging, she declines, also declines daily prophylactic meds at this time, no red flags on exam, neurologically in tact,        Note: This dictation was prepared with Dragon dictation along with smaller phrase technology. Any transcriptional errors that result from this process are unintentional.

## 2014-08-21 NOTE — Patient Instructions (Signed)
We will call with lab results Okay to take over the counter medications for headache F/U as needed

## 2014-08-22 LAB — PROLACTIN: Prolactin: 7.4 ng/mL

## 2014-09-08 ENCOUNTER — Ambulatory Visit: Payer: Self-pay | Admitting: Family Medicine

## 2014-09-22 ENCOUNTER — Ambulatory Visit: Payer: 59 | Admitting: Family Medicine

## 2014-10-09 ENCOUNTER — Telehealth: Payer: Self-pay | Admitting: Family Medicine

## 2014-10-09 MED ORDER — VARENICLINE TARTRATE 1 MG PO TABS: 1.0000 mg | ORAL_TABLET | Freq: Two times a day (BID) | ORAL | Status: DC

## 2014-10-09 NOTE — Telephone Encounter (Signed)
Prescription sent to pharmacy.

## 2014-10-09 NOTE — Telephone Encounter (Signed)
Okay to send in continuing pak

## 2014-10-09 NOTE — Telephone Encounter (Signed)
Patient was given starting month pack in Feb 2016.  Ok to refill?

## 2014-10-09 NOTE — Telephone Encounter (Signed)
Patient would like additional refill on her chantix if possible  [facility] [phone] if any questions

## 2014-12-08 ENCOUNTER — Ambulatory Visit (INDEPENDENT_AMBULATORY_CARE_PROVIDER_SITE_OTHER): Payer: 59 | Admitting: Family Medicine

## 2014-12-08 ENCOUNTER — Encounter: Payer: Self-pay | Admitting: Family Medicine

## 2014-12-08 VITALS — BP 124/70 | HR 78 | Temp 98.3°F | Resp 12 | Ht 65.0 in | Wt 142.0 lb

## 2014-12-08 DIAGNOSIS — Z23 Encounter for immunization: Secondary | ICD-10-CM

## 2014-12-08 DIAGNOSIS — I341 Nonrheumatic mitral (valve) prolapse: Secondary | ICD-10-CM

## 2014-12-08 DIAGNOSIS — R002 Palpitations: Secondary | ICD-10-CM

## 2014-12-08 DIAGNOSIS — R0789 Other chest pain: Secondary | ICD-10-CM

## 2014-12-08 NOTE — Progress Notes (Signed)
Patient ID: Deanna Calderon, female   DOB: 1976/02/10, 39 y.o.   MRN: [id number]   Subjective:    Patient ID: Deanna Calderon, female    DOB: 1975/08/21, 39 y.o.   MRN: [id number]  Patient presents for Chest Pain  patient here with palpitations on and off for the past week. She is history of mitral valve prolapse this was last evaluated greater than 5 years ago. At one point she felt some chest pressure but did not have any shortness of breath no radiating symptoms and nausea vomiting diaphoresis associated. She is more concerned with the palpitations that she will actually see her heart beat at times. She was never put on any medication for the palpitations when her mitral valve prolapse was initially found. She denies any recent illness and change her sleep pattern or change in stress levels that changing caffeine.  Normal labs including TSH less than 3 months ago   Note she never started the chantix  Review Of Systems:  GEN- denies fatigue, fever, weight loss,weakness, recent illness HEENT- denies eye drainage, change in vision, nasal discharge, CVS- denies chest pain, palpitations RESP- denies SOB, cough, wheeze ABD- denies N/V, change in stools, abd pain GU- denies dysuria, hematuria, dribbling, incontinence MSK- denies joint pain, muscle aches, injury Neuro- denies headache, dizziness, syncope, seizure activity       Objective:    BP 124/70 mmHg  Pulse 78  Temp(Src) 98.3 F (36.8 C) (Oral)  Resp 12  Ht 5\' 5"  (1.651 m)  Wt 142 lb (64.411 kg)  BMI 23.63 kg/m2 GEN- NAD, alert and oriented x3 HEENT- PERRL, EOMI, non injected sclera, pink conjunctiva, MMM, oropharynx clear Neck- Supple, no thyromegaly CVS- RRR, 2/6 SEM best at left sternal  RESP-CTAB EXT- No edema Pulses- Radial - 2+   EKG- NSR, no ST changes     Assessment & Plan:      Problem List Items Addressed This Visit    Mitral valve prolapse   Relevant Orders   Echocardiogram    Other Visit Diagnoses     Other chest pain    -  Primary    Relevant Orders    EKG 12-Lead (Completed)    Need for prophylactic vaccination and inoculation against influenza        Relevant Orders    Flu Vaccine QUAD 36+ mos PF IM (Fluarix & Fluzone Quad PF) (Completed)    Heart palpitations        EKG looks good, obtain 2D echo, check electrolyges, Holter monitor placed, she declines medications at this time, will refer to cardiology based on initial work-up    Relevant Orders    Basic metabolic panel    Echocardiogram       Note: This dictation was prepared with Dragon dictation along with smaller phrase technology. Any transcriptional errors that result from this process are unintentional.

## 2014-12-08 NOTE — Patient Instructions (Signed)
2D Echocardiogram Holter monitor and labs Flu shot given  F/U pending results

## 2014-12-09 ENCOUNTER — Telehealth: Payer: Self-pay | Admitting: *Deleted

## 2014-12-09 LAB — BASIC METABOLIC PANEL
BUN: 11 mg/dL (ref 7–25)
CO2: 27 mmol/L (ref 20–31)
Calcium: 9 mg/dL (ref 8.6–10.2)
Chloride: 106 mmol/L (ref 98–110)
Creat: 0.78 mg/dL (ref 0.50–1.10)
Glucose, Bld: 89 mg/dL (ref 70–99)
Potassium: 4 mmol/L (ref 3.5–5.3)
SODIUM: 140 mmol/L (ref 135–146)

## 2014-12-09 NOTE — Telephone Encounter (Signed)
Pt is scheduled for ECHO on Friday Oct 21 at 8:30am at [facility], pt is aware of appt

## 2014-12-09 NOTE — Telephone Encounter (Signed)
-----   Message from Alycia Rossetti, MD sent at 12/08/2014  4:30 PM EDT ----- Regarding: ECHO    PLease schedule 2D Echo for early AM or Tuesday afternoon.  This is Dr. Liliane Channel Nurse

## 2014-12-11 ENCOUNTER — Other Ambulatory Visit (HOSPITAL_COMMUNITY): Payer: No Typology Code available for payment source

## 2014-12-15 ENCOUNTER — Ambulatory Visit (HOSPITAL_COMMUNITY)
Admission: RE | Admit: 2014-12-15 | Discharge: 2014-12-15 | Disposition: A | Discharge status: Home / Self Care | Payer: 59 | Source: Ambulatory Visit | Attending: Family Medicine | Admitting: Family Medicine

## 2014-12-15 DIAGNOSIS — R002 Palpitations: Secondary | ICD-10-CM

## 2014-12-15 DIAGNOSIS — I341 Nonrheumatic mitral (valve) prolapse: Secondary | ICD-10-CM | POA: Diagnosis not present

## 2014-12-15 DIAGNOSIS — I059 Rheumatic mitral valve disease, unspecified: Secondary | ICD-10-CM | POA: Diagnosis present

## 2014-12-16 ENCOUNTER — Other Ambulatory Visit: Payer: Self-pay | Admitting: *Deleted

## 2014-12-16 DIAGNOSIS — I340 Nonrheumatic mitral (valve) insufficiency: Secondary | ICD-10-CM

## 2014-12-16 DIAGNOSIS — I493 Ventricular premature depolarization: Secondary | ICD-10-CM

## 2014-12-16 DIAGNOSIS — I341 Nonrheumatic mitral (valve) prolapse: Secondary | ICD-10-CM

## 2015-01-08 ENCOUNTER — Encounter: Payer: Self-pay | Admitting: Cardiology

## 2015-01-08 ENCOUNTER — Ambulatory Visit (INDEPENDENT_AMBULATORY_CARE_PROVIDER_SITE_OTHER): Payer: 59 | Admitting: Cardiology

## 2015-01-08 VITALS — BP 116/68 | HR 72 | Ht 66.5 in | Wt 144.0 lb

## 2015-01-08 DIAGNOSIS — I341 Nonrheumatic mitral (valve) prolapse: Secondary | ICD-10-CM

## 2015-01-08 DIAGNOSIS — I491 Atrial premature depolarization: Secondary | ICD-10-CM | POA: Diagnosis not present

## 2015-01-08 DIAGNOSIS — Z720 Tobacco use: Secondary | ICD-10-CM

## 2015-01-08 NOTE — Patient Instructions (Signed)
Your physician wants you to follow-up in: 1 year with dr Ferne Reus will receive a reminder letter in the mail two months in advance. If you don't receive a letter, please call our office to schedule the follow-up appointment.    Your physician recommends that you continue on your current medications as directed. Please refer to the Current Medication list given to you today.     If you need a refill on your cardiac medications before your next appointment, please call your pharmacy.    Thank you for choosing [facility] !

## 2015-01-08 NOTE — Progress Notes (Signed)
Cardiology Office Note  Date: 01/08/2015   ID: Deanna Calderon, DOB 1976-02-01, MRN [id number]  PCP: Vic Blackbird, MD  Consulting Cardiologist: Rozann Lesches, MD   Chief Complaint  Patient presents with  . Mitral Valve Prolapse  . Palpitations    History of Present Illness: Deanna Calderon is a 39 y.o. female referred for cardiology consultation by Dr. Buelah Manis. She has a long-standing history of mitral valve prolapse based on record review. She presents today to discuss a recent echocardiogram that was obtained for follow-up, also a history of palpitations. She is very active, exercises 4-5 times a week at the gym doing aerobics and also running 3 miles on the treadmill. She never experiences any exertional symptomatology, but she does have a feeling of palpitations sometimes when she is still and quiet. She has never had syncope associated with her palpitations, and has never had to take any medications for this.  I reviewed her recent echocardiogram performed in late October, results outlined below. I would describe the mitral valve prolapse as being moderate, involving both leaflets, and the degree of mitral regurgitation is only in the apical views in the mild to moderate range, generally closer to the mild end of the spectrum otherwise.  She had a recent cardiac monitor obtained by Dr. Buelah Manis showing sinus rhythm throughout ranging from 56 bpm up to 107 bpm with occasional PACs and rare PVCs. No atrial fibrillation documented.  We discussed the results of her testing, and at this point would recommend observation. She does not need antibiotic SBE prophylaxis based on guidelines.  Past Medical History  Diagnosis Date  . Mitral valve prolapse 2010    Past Surgical History  Procedure Laterality Date  . Bunionectomy Right     Current Outpatient Prescriptions  Medication Sig Dispense Refill  . buPROPion (WELLBUTRIN XL) 300 MG 24 hr tablet Take 1 tablet (300 mg total)  by mouth daily. 90 tablet 3  . cetirizine (ZYRTEC) 10 MG tablet Take 10 mg by mouth daily.    . Multiple Vitamin (MULTIVITAMIN) tablet Take 1 tablet by mouth daily.    . varenicline (CHANTIX CONTINUING MONTH PAK) 1 MG tablet Take 1 tablet (1 mg total) by mouth 2 (two) times daily. 60 tablet 1   No current facility-administered medications for this visit.    Allergies:  Review of patient's allergies indicates no known allergies.   Social History: The patient  reports that she has been smoking Cigarettes.  She has never used smokeless tobacco. She reports that she does not drink alcohol or use illicit drugs.   Family History: The patient's family history includes COPD in her maternal grandfather; Heart disease in her mother.   ROS:  Please see the history of present illness. Otherwise, complete review of systems is positive for palpitations as outlined.  All other systems are reviewed and negative.   Physical Exam: VS:  BP 116/68 mmHg  Pulse 72  Ht 5' 6.5" (1.689 m)  Wt 144 lb (65.318 kg)  BMI 22.90 kg/m2  SpO2 99%, BMI Body mass index is 22.9 kg/(m^2).  Wt Readings from Last 3 Encounters:  01/08/15 144 lb (65.318 kg)  12/08/14 142 lb (64.411 kg)  08/21/14 138 lb (62.596 kg)     General: Normally nourished appearing woman, appears comfortable at rest. HEENT: Conjunctiva and lids normal, oropharynx clear. Neck: Supple, no elevated JVP or carotid bruits, no thyromegaly. Lungs: Clear to auscultation, nonlabored breathing at rest. Cardiac: Regular rate and rhythm, no S3,  soft apical and posterior late systolic murmur, no pericardial rub. Abdomen: Soft, nontender, bowel sounds present. Extremities: No pitting edema, distal pulses 2+. Skin: Warm and dry. Musculoskeletal: No kyphosis. Neuropsychiatric: Alert and oriented x3, affect grossly appropriate.   ECG: Tracing from 12/08/2014 showed normal sinus rhythm with RSR' in lead V1.Marland Kitchen  Recent Labwork: 08/21/2014: ALT 8; AST 14;  Hemoglobin 13.8; Platelets 253; TSH 2.114 12/08/2014: BUN 11; Creat 0.78; Potassium 4.0; Sodium 140     Component Value Date/Time   CHOL 155 09/04/2013 0723   TRIG 71 09/04/2013 0723   HDL 56 09/04/2013 0723   CHOLHDL 2.8 09/04/2013 0723   VLDL 14 09/04/2013 0723   LDLCALC 85 09/04/2013 0723    Other Studies Reviewed Today:  Echocardiogram 12/15/2014: Study Conclusions  - Left ventricle: The cavity size was normal. Wall thickness was normal. Systolic function was normal. The estimated ejection fraction was in the range of 55% to 60%. Wall motion was normal; there were no regional wall motion abnormalities. Left ventricular diastolic function parameters were normal. - Mitral valve: Mildly thickened leaflet tips. Mild bileaflet prolapse. There was mild to moderate regurgitation. - Tricuspid valve: Mildly thickened leaflets. There was mild regurgitation. - Systemic veins: IVC mildly dilated with normal respiratory variation. Estimated CVP 8 mmHg.  ASSESSMENT AND PLAN:  1. Moderate bileaflet mitral valve prolapse associated with mild to moderate mitral regurgitation. She is asymptomatic except for intermittent feeling of palpitations. Otherwise no significant functional limitations, no syncope. Would recommend continued observation for now, we will see her back in one year. She does not need SBE prophylaxis per guidelines.  2. Documented rare PACs and PVCs by recent cardiac monitoring. No other significant arrhythmias.  3. Back her use, she is trying to quit. Currently on Chantix.  Current medicines were reviewed at length with the patient today.  Disposition: FU with me in 1 year.   Signed, Satira Sark, MD, [facility] 01/08/2015 9:37 AM    [facility]. [address] [place], [place] [address] Phone: [phone]; Fax: [phone]

## 2015-03-09 ENCOUNTER — Encounter: Payer: 59 | Admitting: Gynecology

## 2015-03-18 MED FILL — BUPROPION HCL XL 300 MG TAB: 300 | 90 days supply | Qty: 90 | Fill #3

## 2015-04-06 ENCOUNTER — Encounter: Payer: 59 | Admitting: Gynecology

## 2015-07-15 ENCOUNTER — Other Ambulatory Visit: Payer: Self-pay | Admitting: Family Medicine

## 2015-07-15 MED FILL — BUPROPION HCL XL 300 MG TAB: 300 | 90 days supply | Qty: 90 | Fill #0

## 2015-07-15 NOTE — Telephone Encounter (Signed)
Refill appropriate and filled per protocol. 

## 2015-10-22 MED FILL — BUPROPION HCL XL 300 MG TAB: 300 | 90 days supply | Qty: 90 | Fill #1

## 2015-12-13 ENCOUNTER — Ambulatory Visit (INDEPENDENT_AMBULATORY_CARE_PROVIDER_SITE_OTHER): Payer: 59 | Admitting: Family Medicine

## 2015-12-13 ENCOUNTER — Encounter: Payer: Self-pay | Admitting: Family Medicine

## 2015-12-13 VITALS — BP 118/64 | HR 66 | Temp 99.1°F | Resp 14 | Ht 66.5 in | Wt 145.0 lb

## 2015-12-13 DIAGNOSIS — N3001 Acute cystitis with hematuria: Secondary | ICD-10-CM | POA: Diagnosis not present

## 2015-12-13 LAB — URINALYSIS, ROUTINE W REFLEX MICROSCOPIC
BILIRUBIN URINE: NEGATIVE
GLUCOSE, UA: NEGATIVE
Ketones, ur: NEGATIVE
Nitrite: NEGATIVE
PROTEIN: NEGATIVE
Specific Gravity, Urine: 1.01 (ref 1.001–1.035)
pH: 7 (ref 5.0–8.0)

## 2015-12-13 LAB — URINALYSIS, MICROSCOPIC ONLY
CRYSTALS: NONE SEEN [HPF]
Casts: NONE SEEN [LPF]
YEAST: NONE SEEN [HPF]

## 2015-12-13 MED ORDER — FLUCONAZOLE 150 MG PO TABS: ORAL_TABLET | ORAL | 1 refills | Status: DC

## 2015-12-13 MED ORDER — CIPROFLOXACIN HCL 500 MG PO TABS: 500.0000 mg | ORAL_TABLET | Freq: Two times a day (BID) | ORAL | 0 refills | Status: DC

## 2015-12-13 NOTE — Patient Instructions (Signed)
F/U as needed

## 2015-12-13 NOTE — Progress Notes (Signed)
   Subjective:    Patient ID: Deanna Calderon, female    DOB: 1975-07-30, 40 y.o.   MRN: [id number]  Patient presents for Dysuria (x2 days- burning with urintion, small amount of hematuria)      Pt here with dysuria for past 2 days, hematuria noted yesterday. No fever, no N/V LMP    Medications reviewed       Review Of Systems:  GEN- denies fatigue, fever, weight loss,weakness, recent illness HEENT- denies eye drainage, change in vision, nasal discharge, CVS- denies chest pain, palpitations RESP- denies SOB, cough, wheeze ABD- denies N/V, change in stools, abd pain GU-+ dysuria, hematuria, dribbling, incontinence MSK- denies joint pain, muscle aches, injury Neuro- denies headache, dizziness, syncope, seizure activity       Objective:    BP 118/64 (BP Location: Left Arm, Patient Position: Sitting, Cuff Size: Normal)   Pulse 66   Temp 99.1 F (37.3 C) (Oral)   Resp 14   Ht 5' 6.5" (1.689 m)   Wt 145 lb (65.8 kg)   LMP 11/29/2015 (Approximate) Comment: regular  SpO2 99% Comment: RA  BMI 23.05 kg/m  GEN- NAD, alert and oriented x3 CVS- RRR, 2/6 SEM  RESP-CTAB ABD-NABS,soft,NT,ND, no CVA tenderness  Pulses- Radial  2+        Assessment & Plan:      Problem List Items Addressed This Visit    None    Visit Diagnoses    Acute cystitis with hematuria    -  Primary   Treat with Cipro x 5 days,diflucan as she gets yeast infections, culture sent   Relevant Orders   Urinalysis, Routine w reflex microscopic (not at [facility])   Urine culture      Note: This dictation was prepared with Dragon dictation along with smaller phrase technology. Any transcriptional errors that result from this process are unintentional.

## 2015-12-15 LAB — URINE CULTURE

## 2016-02-11 ENCOUNTER — Ambulatory Visit (INDEPENDENT_AMBULATORY_CARE_PROVIDER_SITE_OTHER): Payer: 59 | Admitting: Women's Health

## 2016-02-11 ENCOUNTER — Encounter: Payer: Self-pay | Admitting: Women's Health

## 2016-02-11 VITALS — BP 122/78 | Ht 66.0 in | Wt 145.0 lb

## 2016-02-11 DIAGNOSIS — N6310 Unspecified lump in the right breast, unspecified quadrant: Secondary | ICD-10-CM | POA: Diagnosis not present

## 2016-02-11 NOTE — Progress Notes (Signed)
Presents with complaint of knot in right breast.  First noticed 4 days ago. Denies pain, swelling, skin changes, or nipple discharge.  Drinks 1-2 caffeinated beverages per day/no change.  Monthly cycles, husband vasectomy.  No previous mammogram.  Denies family history of breast cancer.   Exam: Breasts examined lying and sitting. Left: without masses, retractions, nipple discharge or axillary adenopathy.  Right: with mass at 12:00, 5 cm x 3 cm, non tender, firm, smooth, mobile. No retractions, nipple discharge or axillary adenopathy.    Right breast mass  Plan: Will schedule diagnostic mammogram. Reassurance given. Reviewed importance of annual screening mammogram.

## 2016-02-11 NOTE — Patient Instructions (Signed)
Fibroadenoma Introduction Fibroadenoma is a type of breast tumor that is not cancerous (is benign). These tumors are made up of breast tissue and the tissue that holds breast tissue together (connective tissue). There are several types of fibroadenomas:  Simple fibroadenoma. This is the most common type. It consists of a single type of tissue throughout the tumor.  Complex fibroadenoma. This type of tumor contains more than one kind of tissue or irregular tissue.  Juvenile fibroadenoma. This is a type of tumor that can develop in adolescent girls. It tends to grow larger over time than other adenomas. A fibroadenoma usually occurs as a single lump, but sometimes there may be more than one lump. Fibroadenomas vary in size. They can occur in one breast or in both breasts. Some fibroadenomas are too small to feel, but a larger one may feel like a firm, smooth lump that moves beneath your fingers. Although fibroadenomas are not cancer, having a fibroadenoma may slightly increase your risk for developing breast cancer in the future. What are the causes? The exact cause of fibroadenoma is not known. What increases the risk? This condition is more likely to develop in:  Women who are 70-105 years of age.  Women of African-American descent. What are the signs or symptoms? A fibroadenoma may not cause any symptoms. These tumors usually do not cause pain unless they grow to a large size. A fibroadenoma may feel like a lump in your breast that is:  Firm.  Round.  Smooth.  Slightly moveable. How is this diagnosed? You may notice a breast lump during a breast self-exam. Your health care provider may discover it during a routine breast exam or mammogram. Your health care provider may suspect fibroadenoma if you have a breast lump that feels firm, round, and smooth and appears smooth on your mammogram. Other tests may be done to confirm the diagnosis, including:  An ultrasound to check for fluid  inside the lump (cystic tumor).  A procedure that uses a needle to remove fluid from a cystic tumor. The fluid is then checked under a microscope for cancer cells.  A mammogram to examine a lump that is not cystic (is solid).  A procedure that uses a needle to remove a sample of tissue from the lump (breast biopsy) to examine under a microscope. This test is the only method that can be used to confirm that a tumor is a fibroadenoma and is not cancer. How is this treated? Treatment for this condition may include:  Having breast exams regularly to check for changes in your fibroadenoma.  Having the fibroadenoma removed. A fibroadenoma may be removed if it is:  Large.  Continuing to grow.  Causing symptoms.  Changing the appearance of your breast.  A juvenile fibroadenoma. These tend to grow large over time. Follow these instructions at home:   If you had a fibroadenoma removed, follow instructions from your health care provider for care after the procedure.  Perform breast self-exams at home as told by your health care provider.  Keep all follow-up visits as told by your health care provider. This is important. Contact a health care provider if:  Your fibroadenoma becomes larger, feels different, or becomes painful.  You find a new breast lump.  You have any changes in the skin that covers your breast. These include:  Dimpling.  Bruising.  Thickening.  Redness.  You have any changes in your nipple.  You have fluid leaking from your nipple. This information is not intended  to replace advice given to you by your health care provider. Make sure you discuss any questions you have with your health care provider. Document Released: 06/23/2014 Document Revised: 07/15/2015 Document Reviewed: 01/28/2014  2017 Elsevier

## 2016-02-16 ENCOUNTER — Telehealth: Payer: Self-pay | Admitting: *Deleted

## 2016-02-16 DIAGNOSIS — N6310 Unspecified lump in the right breast, unspecified quadrant: Secondary | ICD-10-CM

## 2016-02-16 NOTE — Telephone Encounter (Signed)
Orders placed at breast center, they will contact pt to schedule. 

## 2016-02-16 NOTE — Telephone Encounter (Signed)
-----   Message from Huel Cote, NP sent at 02/11/2016  2:35 PM EST ----- Needs a diagnostic mammogram, at 12:00 5x3 cm mobile smooth slight tenderness, no nipple discharge, no family history of breast cancer. Anytime ok

## 2016-02-18 NOTE — Telephone Encounter (Signed)
Appointment on 02/25/16 @ 8:40am at breast center, pt aware.

## 2016-02-25 ENCOUNTER — Other Ambulatory Visit: Payer: Self-pay | Admitting: Women's Health

## 2016-02-25 ENCOUNTER — Ambulatory Visit
Admission: RE | Admit: 2016-02-25 | Discharge: 2016-02-25 | Disposition: A | Discharge status: Home / Self Care | Payer: 59 | Source: Ambulatory Visit | Attending: Women's Health | Admitting: Women's Health

## 2016-02-25 DIAGNOSIS — N6312 Unspecified lump in the right breast, upper inner quadrant: Secondary | ICD-10-CM | POA: Diagnosis not present

## 2016-02-25 DIAGNOSIS — N6320 Unspecified lump in the left breast, unspecified quadrant: Secondary | ICD-10-CM

## 2016-02-25 DIAGNOSIS — N6310 Unspecified lump in the right breast, unspecified quadrant: Secondary | ICD-10-CM

## 2016-02-25 DIAGNOSIS — N6002 Solitary cyst of left breast: Secondary | ICD-10-CM | POA: Diagnosis not present

## 2016-02-25 DIAGNOSIS — N6001 Solitary cyst of right breast: Secondary | ICD-10-CM | POA: Diagnosis not present

## 2016-03-08 ENCOUNTER — Ambulatory Visit: Payer: Self-pay | Admitting: Family Medicine

## 2016-03-16 ENCOUNTER — Encounter: Payer: 59 | Admitting: Gynecology

## 2016-03-22 DIAGNOSIS — H52223 Regular astigmatism, bilateral: Secondary | ICD-10-CM | POA: Diagnosis not present

## 2016-03-22 DIAGNOSIS — H5213 Myopia, bilateral: Secondary | ICD-10-CM | POA: Diagnosis not present

## 2016-03-22 DIAGNOSIS — H524 Presbyopia: Secondary | ICD-10-CM | POA: Diagnosis not present

## 2016-04-16 ENCOUNTER — Other Ambulatory Visit: Payer: Self-pay | Admitting: Family Medicine

## 2016-05-11 MED FILL — BUPROPION HCL XL 300 MG TAB: 300 | 90 days supply | Qty: 90 | Fill #0

## 2016-07-05 ENCOUNTER — Encounter: Payer: Self-pay | Admitting: Gynecology

## 2016-08-24 MED FILL — BUPROPION HCL XL 300 MG TAB: 300 | 90 days supply | Qty: 90 | Fill #1

## 2016-12-08 ENCOUNTER — Ambulatory Visit (INDEPENDENT_AMBULATORY_CARE_PROVIDER_SITE_OTHER): Payer: 59 | Admitting: Family Medicine

## 2016-12-08 ENCOUNTER — Encounter: Payer: Self-pay | Admitting: Family Medicine

## 2016-12-08 VITALS — BP 122/76 | HR 80 | Temp 98.5°F | Resp 16 | Ht 66.5 in | Wt 144.4 lb

## 2016-12-08 DIAGNOSIS — Z0000 Encounter for general adult medical examination without abnormal findings: Secondary | ICD-10-CM

## 2016-12-08 DIAGNOSIS — Z124 Encounter for screening for malignant neoplasm of cervix: Secondary | ICD-10-CM | POA: Diagnosis not present

## 2016-12-08 DIAGNOSIS — F988 Other specified behavioral and emotional disorders with onset usually occurring in childhood and adolescence: Secondary | ICD-10-CM | POA: Diagnosis not present

## 2016-12-08 MED ORDER — AMPHETAMINE-DEXTROAMPHET ER 20 MG PO CP24: 20.0000 mg | ORAL_CAPSULE | ORAL | 0 refills | Status: DC

## 2016-12-08 MED FILL — ADDERALL XR 20 MG CAP SA: 20 | 30 days supply | Qty: 30 | Fill #0

## 2016-12-08 NOTE — Progress Notes (Signed)
   Subjective:    Patient ID: Deanna Calderon, female    DOB: 1975-10-18, 41 y.o.   MRN: [id number]  Patient presents for Annual Exam and discuss ADD  Pt here for CPE  Due for PAP SMEAR Imminizations UTD Mammogram UTD- due in Jan 2019 Declines hIV testing Due for fasting labs   ADD- states has had difficulty concentrating her entire life, had difficulty in high school but was never on medicaitons, she works as a Marine scientist she has noticed she is very scattered during the day, and her collegues have also mentioned. She state she is use to difficulty keeping things organized at home but at work, she is worried it will affect her performance Sister also has ADD is on Adderall    Review Of Systems:  GEN- denies fatigue, fever, weight loss,weakness, recent illness HEENT- denies eye drainage, change in vision, nasal discharge, CVS- denies chest pain, palpitations RESP- denies SOB, cough, wheeze ABD- denies N/V, change in stools, abd pain GU- denies dysuria, hematuria, dribbling, incontinence MSK- denies joint pain, muscle aches, injury Neuro- denies headache, dizziness, syncope, seizure activity       Objective:    BP 122/76   Pulse 80   Temp 98.5 F (36.9 C) (Oral)   Resp 16   Ht 5' 6.5" (1.689 m)   Wt 144 lb 6.4 oz (65.5 kg)   LMP 11/23/2016 (Approximate)   SpO2 98%   BMI 22.96 kg/m  GEN- NAD, alert and oriented x3 HEENT- PERRL, EOMI, non injected sclera, pink conjunctiva, MMM, oropharynx clear Neck- Supple, no thyromegaly Breast- normal symmetry, no nipple inversion,no nipple drainage, no nodules or lumps felt Nodes- no axillary nodes CVS- RRR,systolic  murmur RESP-CTAB ABD-NABS,soft,NT,ND GEN- NAD, alert and oriented, Neck- supple, no thyromegaly GU- normal external genitalia, vaginal mucosa pink and moist, cervix visualized no growth, no blood form os, no discharge, no CMT, no ovarian masses, uterus normal size Psych- normal affect and mood EXT- No edema Pulses-  Radial, DP- 2+        Assessment & Plan:      Problem List Items Addressed This Visit      Unprioritized   ADD (attention deficit disorder)    Started Adderall 20mg  once a day  F/u via phone/email in a few weeks         Other Visit Diagnoses    Routine general medical examination at a health care facility    -  Primary   CPE done, fasting labs, immunizations UTD, PAP Smear done    Relevant Orders   CBC with Differential/Platelet (Completed)   Comprehensive metabolic panel (Completed)   Lipid panel (Completed)   TSH (Completed)   Cervical cancer screening       Relevant Orders   Pap IG w/ reflex to HPV when ASC-U      Note: This dictation was prepared with Dragon dictation along with smaller phrase technology. Any transcriptional errors that result from this process are unintentional.

## 2016-12-08 NOTE — Patient Instructions (Signed)
Try the adderall 20mg  Call me in a couple of weeks to see if this dose is good I recommend eye visit once a year I recommend dental visit every 6 months Goal is to  Exercise 30 minutes 5 days a week We will call with lab results  F/U 2 months

## 2016-12-09 LAB — LIPID PANEL
Cholesterol: 198 mg/dL (ref <200)
HDL: 66 mg/dL (ref >50)
LDL Cholesterol (Calc): 115 mg/dL (calc) — ABNORMAL HIGH
Non-HDL Cholesterol (Calc): 132 mg/dL (calc) — ABNORMAL HIGH (ref <130)
TRIGLYCERIDES: 76 mg/dL (ref <150)
Total CHOL/HDL Ratio: 3 (calc) (ref <5.0)

## 2016-12-09 LAB — COMPREHENSIVE METABOLIC PANEL
AG RATIO: 2.1 (calc) (ref 1.0–2.5)
ALKALINE PHOSPHATASE (APISO): 50 U/L (ref 33–115)
ALT: 11 U/L (ref 6–29)
AST: 14 U/L (ref 10–30)
Albumin: 4.2 g/dL (ref 3.6–5.1)
BILIRUBIN TOTAL: 0.4 mg/dL (ref 0.2–1.2)
BUN/Creatinine Ratio: 7 (calc) (ref 6–22)
BUN: 6 mg/dL — ABNORMAL LOW (ref 7–25)
CALCIUM: 9.2 mg/dL (ref 8.6–10.2)
CHLORIDE: 106 mmol/L (ref 98–110)
CO2: 27 mmol/L (ref 20–32)
Creat: 0.84 mg/dL (ref 0.50–1.10)
GLOBULIN: 2 g/dL (ref 1.9–3.7)
Glucose, Bld: 88 mg/dL (ref 65–99)
Potassium: 3.9 mmol/L (ref 3.5–5.3)
Sodium: 140 mmol/L (ref 135–146)
Total Protein: 6.2 g/dL (ref 6.1–8.1)

## 2016-12-09 LAB — CBC WITH DIFFERENTIAL/PLATELET
BASOS ABS: 39 {cells}/uL (ref 0–200)
Basophils Relative: 0.6 %
Eosinophils Absolute: 39 cells/uL (ref 15–500)
Eosinophils Relative: 0.6 %
HEMATOCRIT: 39.5 % (ref 35.0–45.0)
HEMOGLOBIN: 13.1 g/dL (ref 11.7–15.5)
Lymphs Abs: 2425 cells/uL (ref 850–3900)
MCH: 29 pg (ref 27.0–33.0)
MCHC: 33.2 g/dL (ref 32.0–36.0)
MCV: 87.4 fL (ref 80.0–100.0)
MONOS PCT: 9 %
MPV: 10.5 fL (ref 7.5–12.5)
Neutro Abs: 3413 cells/uL (ref 1500–7800)
Neutrophils Relative %: 52.5 %
Platelets: 277 10*3/uL (ref 140–400)
RBC: 4.52 10*6/uL (ref 3.80–5.10)
RDW: 11.9 % (ref 11.0–15.0)
Total Lymphocyte: 37.3 %
WBC mixed population: 585 cells/uL (ref 200–950)
WBC: 6.5 10*3/uL (ref 3.8–10.8)

## 2016-12-09 LAB — TSH: TSH: 1.71 mIU/L

## 2016-12-10 NOTE — Assessment & Plan Note (Signed)
Started Adderall 20mg  once a day  F/u via phone/email in a few weeks

## 2016-12-12 LAB — PAP IG W/ RFLX HPV ASCU

## 2016-12-14 ENCOUNTER — Encounter: Payer: Self-pay | Admitting: *Deleted

## 2017-01-04 ENCOUNTER — Other Ambulatory Visit: Payer: Self-pay | Admitting: Family Medicine

## 2017-01-04 NOTE — Telephone Encounter (Signed)
Ok to refill??  Last office visit/ refill 12/08/2016.

## 2017-01-04 NOTE — Telephone Encounter (Signed)
Okay to refill, when you call her see if dose is doing okay

## 2017-01-05 MED ORDER — AMPHETAMINE-DEXTROAMPHET ER 20 MG PO CP24: 20.0000 mg | ORAL_CAPSULE | ORAL | 0 refills | Status: DC

## 2017-01-05 MED FILL — ADDERALL XR 20 MG CAP SA: 20 | 30 days supply | Qty: 30 | Fill #0

## 2017-02-07 ENCOUNTER — Ambulatory Visit (INDEPENDENT_AMBULATORY_CARE_PROVIDER_SITE_OTHER): Payer: 59 | Admitting: Family Medicine

## 2017-02-07 ENCOUNTER — Other Ambulatory Visit: Payer: Self-pay

## 2017-02-07 ENCOUNTER — Encounter: Payer: Self-pay | Admitting: Family Medicine

## 2017-02-07 VITALS — BP 126/72 | HR 70 | Temp 97.8°F | Resp 14 | Ht 66.5 in | Wt 143.0 lb

## 2017-02-07 DIAGNOSIS — F330 Major depressive disorder, recurrent, mild: Secondary | ICD-10-CM

## 2017-02-07 DIAGNOSIS — F988 Other specified behavioral and emotional disorders with onset usually occurring in childhood and adolescence: Secondary | ICD-10-CM

## 2017-02-07 MED ORDER — AMPHETAMINE-DEXTROAMPHET ER 20 MG PO CP24
20.0000 mg | ORAL_CAPSULE | ORAL | 0 refills | Status: DC
Start: 2017-02-07 — End: 2017-02-07

## 2017-02-07 MED ORDER — AMPHETAMINE-DEXTROAMPHET ER 20 MG PO CP24: 20.0000 mg | ORAL_CAPSULE | ORAL | 0 refills | Status: DC

## 2017-02-07 NOTE — Assessment & Plan Note (Signed)
She is doing well on the Adderall will continue at the 20 mg.  She does have concurrent Wellbutrin which were using for her depression which she is done well with she has not had any worsening symptoms since being on the stimulant.

## 2017-02-07 NOTE — Patient Instructions (Signed)
F/U 6 months

## 2017-02-07 NOTE — Progress Notes (Signed)
   Subjective:    Patient ID: Len Childs, female    DOB: [birth year], 41 y.o.   MRN: [id number]  Patient presents for Follow-up (is not fasting)  Here to follow-up Adderall for her ADD.  She states she has been doing well with the medication about 4:00 but she is fine with the current dose.  She is more focused at work does not feel frazzled.  She is sleeping well.  Has not had any change in her appetite.  She denies any anxiety or depression symptoms since she has been on the medication.  No new concerns today.   Review Of Systems:  GEN- denies fatigue, fever, weight loss,weakness, recent illness HEENT- denies eye drainage, change in vision, nasal discharge, CVS- denies chest pain, palpitations RESP- denies SOB, cough, wheeze ABD- denies N/V, change in stools, abd pain GU- denies dysuria, hematuria, dribbling, incontinence MSK- denies joint pain, muscle aches, injury Neuro- denies headache, dizziness, syncope, seizure activity       Objective:    BP 126/72   Pulse 70   Temp 97.8 F (36.6 C) (Oral)   Resp 14   Ht 5' 6.5" (1.689 m)   Wt 143 lb (64.9 kg)   LMP 01/10/2017 Comment: regular  SpO2 100%   BMI 22.74 kg/m  GEN- NAD, alert and oriented x3 CVS- RRR, no murmur RESP-CTAB Psych- normal affect and mood        Assessment & Plan:  He was given prescriptions for the next 3 months December January February   Problem List Items Addressed This Visit      Unprioritized   MDD (major depressive disorder)    Doing well on Wellbutrin has been on this for the past few years.      ADD (attention deficit disorder) - Primary    She is doing well on the Adderall will continue at the 20 mg.  She does have concurrent Wellbutrin which were using for her depression which she is done well with she has not had any worsening symptoms since being on the stimulant.         Note: This dictation was prepared with Dragon dictation along with smaller phrase technology. Any  transcriptional errors that result from this process are unintentional.

## 2017-02-07 NOTE — Assessment & Plan Note (Signed)
Doing well on Wellbutrin has been on this for the past few years.

## 2017-02-16 MED FILL — ADDERALL XR 20 MG CAP SA: 20 | 30 days supply | Qty: 30 | Fill #0

## 2017-03-22 MED FILL — ADDERALL XR 20 MG CAP SA: 20 | 30 days supply | Qty: 30 | Fill #0

## 2017-04-17 ENCOUNTER — Encounter: Payer: Self-pay | Admitting: Family Medicine

## 2017-04-17 MED ORDER — FLUCONAZOLE 150 MG PO TABS: 150.0000 mg | ORAL_TABLET | Freq: Once | ORAL | 0 refills | Status: AC

## 2017-04-17 MED FILL — FLUCONAZOLE 150 MG TABLET: 150 | 1 days supply | Qty: 1 | Fill #0

## 2017-04-20 MED FILL — ADDERALL XR 20 MG CAP SA: 20 | 30 days supply | Qty: 30 | Fill #0

## 2017-05-16 ENCOUNTER — Other Ambulatory Visit: Payer: Self-pay | Admitting: Family Medicine

## 2017-05-16 MED ORDER — AMPHETAMINE-DEXTROAMPHET ER 20 MG PO CP24: 20.0000 mg | ORAL_CAPSULE | ORAL | 0 refills | Status: DC

## 2017-05-16 NOTE — Telephone Encounter (Signed)
Ok to refill??  Last office visit/ refill 02/07/2017, #2 printed prescriptions.

## 2017-05-22 ENCOUNTER — Encounter: Payer: Self-pay | Admitting: Family Medicine

## 2017-05-22 MED ORDER — BUPROPION HCL ER (XL) 300 MG PO TB24: 300.0000 mg | ORAL_TABLET | Freq: Every day | ORAL | 1 refills | Status: DC

## 2017-05-22 MED FILL — ADDERALL XR 20 MG CAP SA: 20 | 30 days supply | Qty: 30 | Fill #0

## 2017-05-22 MED FILL — BUPROPION HCL XL 300 MG TAB: 300 | 90 days supply | Qty: 90 | Fill #0

## 2017-06-06 ENCOUNTER — Ambulatory Visit (HOSPITAL_COMMUNITY)
Admission: RE | Admit: 2017-06-06 | Discharge: 2017-06-06 | Disposition: A | Discharge status: Home / Self Care | Payer: 59 | Source: Ambulatory Visit | Attending: Family Medicine | Admitting: Family Medicine

## 2017-06-06 ENCOUNTER — Other Ambulatory Visit: Payer: Self-pay

## 2017-06-06 ENCOUNTER — Encounter: Payer: Self-pay | Admitting: Family Medicine

## 2017-06-06 ENCOUNTER — Ambulatory Visit (INDEPENDENT_AMBULATORY_CARE_PROVIDER_SITE_OTHER): Payer: 59 | Admitting: Family Medicine

## 2017-06-06 VITALS — BP 122/64 | HR 76 | Temp 98.3°F | Resp 12 | Ht 65.5 in | Wt 140.0 lb

## 2017-06-06 DIAGNOSIS — M79661 Pain in right lower leg: Secondary | ICD-10-CM

## 2017-06-06 DIAGNOSIS — M7989 Other specified soft tissue disorders: Secondary | ICD-10-CM | POA: Insufficient documentation

## 2017-06-06 MED ORDER — NAPROXEN 500 MG PO TABS: 500.0000 mg | ORAL_TABLET | Freq: Two times a day (BID) | ORAL | 0 refills | Status: DC

## 2017-06-06 MED FILL — NAPROXEN 500 MG TABLET: 500 | 15 days supply | Qty: 30 | Fill #0

## 2017-06-06 NOTE — Patient Instructions (Signed)
[place] of Calf to be done

## 2017-06-06 NOTE — Progress Notes (Signed)
   Subjective:    Patient ID: Deanna Calderon, female    DOB: [birth year], 42 y.o.   MRN: [id number]  Patient presents for R Calf Pain (x1 week- tenderness to touch, edema, cramping)    Right calf pain and swelling, had severe cramp first night of pain 1 week ago, has had cramps o and off, and then noticed swelling in calf, no injury, no OCP, no travel Typically works out, but has not done so in 2 weeks  No difficulty breathing  Has taken a few doses of ibuprofen      Review Of Systems:  GEN- denies fatigue, fever, weight loss,weakness, recent illness HEENT- denies eye drainage, change in vision, nasal discharge, CVS- denies chest pain, palpitations RESP- denies SOB, cough, wheeze ABD- denies N/V, change in stools, abd pain GU- denies dysuria, hematuria, dribbling, incontinence MSK- denies joint pain,+ muscle aches, injury Neuro- denies headache, dizziness, syncope, seizure activity       Objective:    BP 122/64   Pulse 76   Temp 98.3 F (36.8 C) (Oral)   Resp 12   Ht 5' 5.5" (1.664 m)   Wt 140 lb (63.5 kg)   SpO2 97%   BMI 22.94 kg/m  GEN- NAD, alert and oriented x3 CVS- RRR, no murmur RESP-CTAB MSK- FROM knee/ankle  EXT- No edema, Right calf 0.5inches larger than left,mild TTP calf, no cords palpated no erythema, + homan's right side, no swelling left , no color change to legs  Pulses- Radial, DP- 2+        Assessment & Plan:      Problem List Items Addressed This Visit    None    Visit Diagnoses    Calf swelling    -  Primary   Concern for DVT, no sign of phlebitis, other possibilty is calf strain. STAT US DONE, NO DVT, ICE, elevate, NSAIDS should improve symptoms   Relevant Orders   US Venous Img Lower Unilateral Right (Completed)   Right calf pain       Relevant Orders   US Venous Img Lower Unilateral Right (Completed)      Note: This dictation was prepared with Dragon dictation along with smaller phrase technology. Any transcriptional errors  that result from this process are unintentional.

## 2017-06-26 ENCOUNTER — Other Ambulatory Visit: Payer: Self-pay | Admitting: Family Medicine

## 2017-06-26 MED ORDER — AMPHETAMINE-DEXTROAMPHET ER 20 MG PO CP24: 20.0000 mg | ORAL_CAPSULE | ORAL | 0 refills | Status: DC

## 2017-06-26 MED FILL — ADDERALL XR 20 MG CAP SA: 20 | 30 days supply | Qty: 30 | Fill #0

## 2017-06-26 NOTE — Telephone Encounter (Signed)
Ok to refill??  Last office visit 06/06/2017.  Last refill 05/16/2017.

## 2017-07-10 ENCOUNTER — Encounter: Payer: Self-pay | Admitting: Family Medicine

## 2017-07-13 ENCOUNTER — Ambulatory Visit (HOSPITAL_COMMUNITY)
Admission: RE | Admit: 2017-07-13 | Discharge: 2017-07-13 | Disposition: A | Discharge status: Home / Self Care | Payer: 59 | Source: Ambulatory Visit | Attending: Family Medicine | Admitting: Family Medicine

## 2017-07-13 ENCOUNTER — Other Ambulatory Visit: Payer: Self-pay

## 2017-07-13 ENCOUNTER — Ambulatory Visit (INDEPENDENT_AMBULATORY_CARE_PROVIDER_SITE_OTHER): Payer: 59 | Admitting: Family Medicine

## 2017-07-13 ENCOUNTER — Encounter: Payer: Self-pay | Admitting: Family Medicine

## 2017-07-13 VITALS — BP 118/72 | HR 73 | Temp 98.0°F | Resp 16 | Ht 65.5 in | Wt 136.2 lb

## 2017-07-13 DIAGNOSIS — R109 Unspecified abdominal pain: Secondary | ICD-10-CM | POA: Insufficient documentation

## 2017-07-13 DIAGNOSIS — R1011 Right upper quadrant pain: Secondary | ICD-10-CM | POA: Diagnosis not present

## 2017-07-13 LAB — COMPLETE METABOLIC PANEL WITH GFR
AG RATIO: 2.2 (calc) (ref 1.0–2.5)
ALT: 8 U/L (ref 6–29)
AST: 12 U/L (ref 10–30)
Albumin: 4.3 g/dL (ref 3.6–5.1)
Alkaline phosphatase (APISO): 52 U/L (ref 33–115)
BUN: 7 mg/dL (ref 7–25)
CALCIUM: 9.3 mg/dL (ref 8.6–10.2)
CHLORIDE: 106 mmol/L (ref 98–110)
CO2: 29 mmol/L (ref 20–32)
Creat: 0.8 mg/dL (ref 0.50–1.10)
GFR, EST AFRICAN AMERICAN: 105 mL/min/{1.73_m2} (ref >=60)
GFR, EST NON AFRICAN AMERICAN: 90 mL/min/{1.73_m2} (ref >=60)
GLUCOSE: 75 mg/dL (ref 65–99)
Globulin: 2 g/dL (calc) (ref 1.9–3.7)
POTASSIUM: 4.4 mmol/L (ref 3.5–5.3)
Sodium: 139 mmol/L (ref 135–146)
TOTAL PROTEIN: 6.3 g/dL (ref 6.1–8.1)
Total Bilirubin: 0.4 mg/dL (ref 0.2–1.2)

## 2017-07-13 LAB — CBC WITH DIFFERENTIAL/PLATELET
BASOS PCT: 0.3 %
Basophils Absolute: 21 cells/uL (ref 0–200)
Eosinophils Absolute: 43 cells/uL (ref 15–500)
Eosinophils Relative: 0.6 %
HCT: 39.7 % (ref 35.0–45.0)
HEMOGLOBIN: 13.4 g/dL (ref 11.7–15.5)
Lymphs Abs: 1988 cells/uL (ref 850–3900)
MCH: 29.6 pg (ref 27.0–33.0)
MCHC: 33.8 g/dL (ref 32.0–36.0)
MCV: 87.6 fL (ref 80.0–100.0)
MONOS PCT: 10.1 %
MPV: 10.2 fL (ref 7.5–12.5)
Neutro Abs: 4331 cells/uL (ref 1500–7800)
Neutrophils Relative %: 61 %
PLATELETS: 272 10*3/uL (ref 140–400)
RBC: 4.53 10*6/uL (ref 3.80–5.10)
RDW: 11.8 % (ref 11.0–15.0)
TOTAL LYMPHOCYTE: 28 %
WBC: 7.1 10*3/uL (ref 3.8–10.8)
WBCMIX: 717 {cells}/uL (ref 200–950)

## 2017-07-13 LAB — PREGNANCY, URINE: PREG TEST UR: NEGATIVE

## 2017-07-13 LAB — URINALYSIS, ROUTINE W REFLEX MICROSCOPIC
BILIRUBIN URINE: NEGATIVE
GLUCOSE, UA: NEGATIVE
Hgb urine dipstick: NEGATIVE
Ketones, ur: NEGATIVE
LEUKOCYTES UA: NEGATIVE
Nitrite: NEGATIVE
PH: 6 (ref 5.0–8.0)
PROTEIN: NEGATIVE
Specific Gravity, Urine: 1.003 (ref 1.001–1.03)

## 2017-07-13 LAB — LIPASE: Lipase: 130 U/L — ABNORMAL HIGH (ref 7–60)

## 2017-07-13 MED ORDER — ONDANSETRON 4 MG PO TBDP: 4.0000 mg | ORAL_TABLET | Freq: Three times a day (TID) | ORAL | 0 refills | Status: DC | PRN

## 2017-07-13 NOTE — Progress Notes (Signed)
Labs normal except elevated lipase, ultrasounds all normal.  I can call in pain medication if she needs it, LMK.   If she drinks alcohol she needs to avoid.  I did prescribe nausea medication.

## 2017-07-13 NOTE — Progress Notes (Signed)
Patient in today with c/ abdominal pain, dizziness, nausea, and spotting.

## 2017-07-13 NOTE — Progress Notes (Signed)
Patient ID: Deanna Calderon, female    DOB: Nov 09, 1975, 42 y.o.   MRN: [id number]  PCP: Alycia Rossetti, MD  Chief Complaint  Patient presents with  . Abdominal Pain    Subjective:   Deanna Calderon is a 42 y.o. female, presents to clinic with CC of abdominal pain x 4 days and also vaginal spotting/irregular menses.  She is a current smoker, past medical history of depression, ADD and mitral valve prolapse.  Patient states that she is been having 4 days of random onset, intermittent RUQ pain that is severe, rated 7 out of 10, described as cramping, radiation, last for a few minutes and then resolves.  She has associated constant nausea but she denies any other symptoms associated.  Yesterday she had 5 episodes of the severe pain.  It "stops her in her tracks" for example yesterday she is at the grocery store and she had to leave all of her food and exit the store because the pain was so severe.  She denies any pain being associated with eating.  She denies any radiation of pain to back, epigastric area, flank, lower quadrants or genitals.  No vomiting episodes but yesterday she had one looser bowel movement.  She denies any change to her appetite, denies acid reflux, dyspepsia, bloating, sick contacts, new foods, change in medication.  She denies melena, hematochezia, dysuria, hematuria, urinary frequency, urinary urgency, vaginal discharge, dyspareunia.  She is also had some vaginal spotting that started 4 days ago and ended yesterday.  This is uncommon for her, she has monthly regular periods, LMP est 06/22/17, then she had no period for about 1/2 weeks and then began spotting.  She is G2P2, sexually active and monogamous with husband who had a vasectomy a long time ago.  Denies any possibility of pregnancy, not on OCP's.   Patient states the only other associated symptoms with some mild cramping like period cramps, located to pelvis.  Yesterday a few episodes where she felt slightly dizzy  with a spinning sensation.  Denies any lightheaded symptoms where she felt like she might black out denies near syncope, headaches, weakness, fatigue.  She denies any history of gallstones, kidney stones, no abdominal or pelvic surgeries.  Last Pap December 08, 2016 was negative  Patient Active Problem List   Diagnosis Date Noted  . ADD (attention deficit disorder) 12/08/2016  . Mitral valve prolapse 12/08/2014  . Other malaise and fatigue 09/03/2013  . MDD (major depressive disorder) 01/01/2013  . Tobacco use disorder 01/01/2013     Prior to Admission medications   Medication Sig Start Date End Date Taking? Authorizing Provider  amphetamine-dextroamphetamine (ADDERALL XR) 20 MG 24 hr capsule Take 1 capsule (20 mg total) by mouth every morning. 06/26/17  Yes [place], Modena Nunnery, MD  buPROPion (WELLBUTRIN XL) 300 MG 24 hr tablet Take 1 tablet (300 mg total) by mouth daily. 05/22/17  Yes [place], Modena Nunnery, MD  naproxen (NAPROSYN) 500 MG tablet Take 1 tablet (500 mg total) by mouth 2 (two) times daily with a meal. Patient not taking: Reported on 07/13/2017 06/06/17   Alycia Rossetti, MD     No Known Allergies   Family History  Problem Relation Age of Onset  . Heart disease Mother        Had MI 2012  . COPD Maternal Grandfather      Social History   Socioeconomic History  . Marital status: Married    Spouse name: Not on file  .  Number of children: Not on file  . Years of education: Not on file  . Highest education level: Not on file  Occupational History  . Not on file  Social Needs  . Financial resource strain: Not on file  . Food insecurity:    Worry: Not on file    Inability: Not on file  . Transportation needs:    Medical: Not on file    Non-medical: Not on file  Tobacco Use  . Smoking status: Current Some Day Smoker    Types: Cigarettes  . Smokeless tobacco: Never Used  Substance and Sexual Activity  . Alcohol use: No    Alcohol/week: 0.0 oz  . Drug use: No  .  Sexual activity: Yes    Birth control/protection: None    Comment: PATIENT'S HUSBAND WITH VASECTOMY.. 1st intercourse- 14, partners- 3  Lifestyle  . Physical activity:    Days per week: Not on file    Minutes per session: Not on file  . Stress: Not on file  Relationships  . Social connections:    Talks on phone: Not on file    Gets together: Not on file    Attends religious service: Not on file    Active member of club or organization: Not on file    Attends meetings of clubs or organizations: Not on file    Relationship status: Not on file  . Intimate partner violence:    Fear of current or ex partner: Not on file    Emotionally abused: Not on file    Physically abused: Not on file    Forced sexual activity: Not on file  Other Topics Concern  . Not on file  Social History Narrative  . Not on file     Review of Systems  Constitutional: Negative.  Negative for appetite change, chills, diaphoresis, fatigue and fever.  HENT: Negative.   Eyes: Negative.   Respiratory: Negative.  Negative for chest tightness and shortness of breath.   Cardiovascular: Negative.   Gastrointestinal: Positive for abdominal pain and nausea. Negative for abdominal distention, anal bleeding, blood in stool, constipation, rectal pain and vomiting.  Endocrine: Negative.   Genitourinary: Positive for flank pain, menstrual problem, pelvic pain and vaginal bleeding. Negative for decreased urine volume, difficulty urinating, dyspareunia, dysuria, enuresis, frequency, genital sores, hematuria, urgency, vaginal discharge and vaginal pain.  Musculoskeletal: Negative for arthralgias, back pain, gait problem, joint swelling and myalgias.  Skin: Negative.  Negative for color change, pallor and rash.  Allergic/Immunologic: Negative.   Hematological: Negative.   Psychiatric/Behavioral: Negative.   All other systems reviewed and are negative.      Objective:    Vitals:   07/13/17 0818  BP: 118/72  Pulse: 73    Resp: 16  Temp: 98 F (36.7 C)  TempSrc: Oral  SpO2: 99%  Weight: 136 lb 4 oz (61.8 kg)  Height: 5' 5.5" (1.664 m)      Physical Exam  Constitutional: She is oriented to person, place, and time. She appears well-developed and well-nourished.  Non-toxic appearance. She does not appear ill. No distress.  HENT:  Head: Normocephalic and atraumatic.  Right Ear: External ear normal.  Left Ear: External ear normal.  Nose: Nose normal.  Mouth/Throat: Uvula is midline, oropharynx is clear and moist and mucous membranes are normal.  Eyes: Pupils are equal, round, and reactive to light. Conjunctivae, EOM and lids are normal.  Neck: Normal range of motion and phonation normal. Neck supple. No tracheal deviation present.  Cardiovascular: Normal rate, regular rhythm, normal heart sounds and normal pulses. Exam reveals no gallop and no friction rub.  No murmur heard. Pulses:      Radial pulses are 2+ on the right side, and 2+ on the left side.       Posterior tibial pulses are 2+ on the right side, and 2+ on the left side.  Pulmonary/Chest: Effort normal and breath sounds normal. No stridor. No respiratory distress. She has no wheezes. She has no rhonchi. She has no rales. She exhibits no tenderness.  Abdominal: Soft. Normal appearance and bowel sounds are normal. She exhibits no distension, no pulsatile liver and no pulsatile midline mass. There is no hepatosplenomegaly. There is tenderness in the right upper quadrant, epigastric area, suprapubic area and left lower quadrant. There is no rigidity, no rebound, no guarding, no CVA tenderness and no tenderness at McBurney's point. No hernia.  Musculoskeletal: Normal range of motion. She exhibits no edema or deformity.  Lymphadenopathy:    She has no cervical adenopathy.  Neurological: She is alert and oriented to person, place, and time. She exhibits normal muscle tone. Gait normal.  Skin: Skin is warm, dry and intact. Capillary refill takes less  than 2 seconds. No rash noted. She is not diaphoretic. No pallor.  Psychiatric: She has a normal mood and affect. Her speech is normal and behavior is normal.  Nursing note and vitals reviewed.         Assessment & Plan:      ICD-10-CM   1. Abdominal pain, unspecified abdominal location R10.9 Urinalysis, Routine w reflex microscopic    Pregnancy, urine    COMPLETE METABOLIC PANEL WITH GFR    Lipase    CBC with Differential    DG Abd 1 View    US PELVIC COMPLETE WITH TRANSVAGINAL    US PELVIC DOPPLER (TORSION R/O OR MASS ARTERIAL FLOW)    US Renal    US Abdomen Limited RUQ    ondansetron (ZOFRAN ODT) 4 MG disintegrating tablet    CANCELED: US Abdomen Limited    42 year old female with unusual presentation of abdominal pain.  She is having colicky episodes of severe right upper quadrant abdominal pain, lasts a few minutes and then resolves, without radiation, and only associated symptom is nausea for 4 days.  She denies any change of pain or precipitation of pain by eating.  No change with positional changes.  She also has 4 days of irregular vaginal spotting and normally her.  Is very regular this is associated with some pelvic cramping.  She reports the spotting to been very light, resolved yesterday, no active vaginal bleeding or spotting today.  She is sexually active with one partner, had vasectomy, no chance of pregnancy, urine dip appears negative urine pregnancy is negative.  She has no other vaginal complaints and no urinary complaints.  The refuses any genital or pelvic exam or testing.  Only other thing pertinent in her history is slightly looser stools, may be GI virus.  However presentation is so unusual the differential is very large. Exam was pertinent for left lower quadrant abdominal tenderness to palpation.  There was severity of pain only to work-up pelvic pain and irregular menses with pelvic ultrasound, to rule out cholelithiasis right upper quadrant ultrasound, renal  ultrasound as well.  CMP, lipase and CBC.  Will treat nausea.  Feel all this can be done outpatient patient does not have acute abdomen she has no guarding, tenderness or rigidity on exam.  She is able to eat and drink.  Remaining treatment will be guided by all results.  Delsa Grana, PA-C 07/13/17 8:31 AM   Results for orders placed or performed in visit on 07/13/17  Urinalysis, Routine w reflex microscopic  Result Value Ref Range   Color, Urine YELLOW YELLOW   APPearance CLEAR CLEAR   Specific Gravity, Urine 1.003 1.001 - 1.03   pH 6.0 5.0 - 8.0   Glucose, UA NEGATIVE NEGATIVE   Bilirubin Urine NEGATIVE NEGATIVE   Ketones, ur NEGATIVE NEGATIVE   Hgb urine dipstick NEGATIVE NEGATIVE   Protein, ur NEGATIVE NEGATIVE   Nitrite NEGATIVE NEGATIVE   Leukocytes, UA NEGATIVE NEGATIVE  Pregnancy, urine  Result Value Ref Range   Preg Test, Ur NEGATIVE NEGATIVE  COMPLETE METABOLIC PANEL WITH GFR  Result Value Ref Range   Glucose, Bld 75 65 - 99 mg/dL   BUN 7 7 - 25 mg/dL   Creat 0.80 0.50 - 1.10 mg/dL   GFR, Est Non African American 90 > OR = 60 mL/min/1.74m2   GFR, Est African American 105 > OR = 60 mL/min/1.29m2   BUN/Creatinine Ratio NOT APPLICABLE 6 - 22 (calc)   Sodium 139 135 - 146 mmol/L   Potassium 4.4 3.5 - 5.3 mmol/L   Chloride 106 98 - 110 mmol/L   CO2 29 20 - 32 mmol/L   Calcium 9.3 8.6 - 10.2 mg/dL   Total Protein 6.3 6.1 - 8.1 g/dL   Albumin 4.3 3.6 - 5.1 g/dL   Globulin 2.0 1.9 - 3.7 g/dL (calc)   AG Ratio 2.2 1.0 - 2.5 (calc)   Total Bilirubin 0.4 0.2 - 1.2 mg/dL   Alkaline phosphatase (APISO) 52 33 - 115 U/L   AST 12 10 - 30 U/L   ALT 8 6 - 29 U/L  Lipase  Result Value Ref Range   Lipase 130 (H) 7 - 60 U/L  CBC with Differential  Result Value Ref Range   WBC 7.1 3.8 - 10.8 Thousand/uL   RBC 4.53 3.80 - 5.10 Million/uL   Hemoglobin 13.4 11.7 - 15.5 g/dL   HCT 39.7 35.0 - 45.0 %   MCV 87.6 80.0 - 100.0 fL   MCH 29.6 27.0 - 33.0 pg   MCHC 33.8 32.0 - 36.0  g/dL   RDW 11.8 11.0 - 15.0 %   Platelets 272 140 - 400 Thousand/uL   MPV 10.2 7.5 - 12.5 fL   Neutro Abs 4,331 1,500 - 7,800 cells/uL   Lymphs Abs 1,988 850 - 3,900 cells/uL   WBC mixed population 717 200 - 950 cells/uL   Eosinophils Absolute 43 15 - 500 cells/uL   Basophils Absolute 21 0 - 200 cells/uL   Neutrophils Relative % 61 %   Total Lymphocyte 28.0 %   Monocytes Relative 10.1 %   Eosinophils Relative 0.6 %   Basophils Relative 0.3 %   Lipase is elevated, not sure of the significance of this.  She did not have left upper quadrant tenderness, history was not concerning for pancreatitis.  She was contacted and instructed to avoid alcohol, although she denies hx of any alcohol use.   All other labs are reassuring and unremarkable.  Urine dip was negative.  All ultrasounds were unremarkable as well, no obstructing kidney stones noted, no hydronephrosis, no cholelithiasis, ovaries and uterus normal appearing, no ovarian torsion.  Pt instructed to follow up if sx continue or do not improve.   Pt verbalized understanding of ER precautions.

## 2017-07-27 ENCOUNTER — Other Ambulatory Visit: Payer: Self-pay | Admitting: Family Medicine

## 2017-07-27 MED ORDER — AMPHETAMINE-DEXTROAMPHET ER 20 MG PO CP24: 20.0000 mg | ORAL_CAPSULE | ORAL | 0 refills | Status: DC

## 2017-07-27 MED FILL — ADDERALL XR 20 MG CAP SA: 20 | 30 days supply | Qty: 30 | Fill #0

## 2017-07-27 NOTE — Telephone Encounter (Signed)
Ok to refill??  Last office visit 06/06/2017.  Last refill 06/26/2017.

## 2017-08-08 ENCOUNTER — Encounter: Payer: Self-pay | Admitting: Family Medicine

## 2017-08-08 ENCOUNTER — Other Ambulatory Visit: Payer: Self-pay

## 2017-08-08 ENCOUNTER — Ambulatory Visit (INDEPENDENT_AMBULATORY_CARE_PROVIDER_SITE_OTHER): Payer: 59 | Admitting: Family Medicine

## 2017-08-08 VITALS — BP 122/70 | HR 68 | Temp 97.9°F | Resp 14 | Ht 65.5 in | Wt 136.0 lb

## 2017-08-08 DIAGNOSIS — F330 Major depressive disorder, recurrent, mild: Secondary | ICD-10-CM | POA: Diagnosis not present

## 2017-08-08 DIAGNOSIS — F17200 Nicotine dependence, unspecified, uncomplicated: Secondary | ICD-10-CM

## 2017-08-08 DIAGNOSIS — F988 Other specified behavioral and emotional disorders with onset usually occurring in childhood and adolescence: Secondary | ICD-10-CM

## 2017-08-08 MED ORDER — AMPHETAMINE-DEXTROAMPHET ER 20 MG PO CP24: 20.0000 mg | ORAL_CAPSULE | ORAL | 0 refills | Status: DC

## 2017-08-08 MED ORDER — BUPROPION HCL ER (XL) 300 MG PO TB24: 300.0000 mg | ORAL_TABLET | Freq: Every day | ORAL | 1 refills | Status: DC

## 2017-08-08 NOTE — Patient Instructions (Signed)
Schedule your mammogram F/U December for Physical

## 2017-08-08 NOTE — Assessment & Plan Note (Signed)
Discussed tobacco cessation.  She has tried Nicorette gum this actually caused some side effects.  She smokes such minimally but I think she can just put them down altogether or use gum or mints as an alternative when she does reach for cigarette.

## 2017-08-08 NOTE — Assessment & Plan Note (Signed)
Doing well with Adderall no change to dose

## 2017-08-08 NOTE — Assessment & Plan Note (Signed)
Doing well on Wellbutrin no changes to medication.

## 2017-08-08 NOTE — Progress Notes (Signed)
   Subjective:    Patient ID: Deanna Calderon, female    DOB: 1975-08-28, 42 y.o.   MRN: [id number]  Patient presents for Follow-up (is not fasting)  Pt here to f/u chronic medications.   She is on Adderall for ADD, doing well with medication, she does take it on the weekends occasionally.  She is able to get her work done and stay on task.  No side effects, appetite is good  blood pressure is good   MDD- doing well on wellbutrin, she has missed a few doses and could tell uin her mood so want to continue taking the medication daily.  She does continue to smoke but often will go a couple days without smoking anything.  At most she will have 3 cigarettes a day.   Review Of Systems:  GEN- denies fatigue, fever, weight loss,weakness, recent illness HEENT- denies eye drainage, change in vision, nasal discharge, CVS- denies chest pain, palpitations RESP- denies SOB, cough, wheeze ABD- denies N/V, change in stools, abd pain GU- denies dysuria, hematuria, dribbling, incontinence MSK- denies joint pain, muscle aches, injury Neuro- denies headache, dizziness, syncope, seizure activity       Objective:    BP 122/70   Pulse 68   Temp 97.9 F (36.6 C) (Oral)   Resp 14   Ht 5' 5.5" (1.664 m)   Wt 136 lb (61.7 kg)   SpO2 100%   BMI 22.29 kg/m  GEN- NAD, alert and oriented x3 HEENT- PERRL, EOMI, non injected sclera, pink conjunctiva, MMM, oropharynx clear Neck- Supple, no thyromegaly CVS- RRR, no murmur RESP-CTAB Psych- Normal affect and mood  EXT- No edema Pulses- Radial, DP- 2+        Assessment & Plan:      Problem List Items Addressed This Visit      Unprioritized   ADD (attention deficit disorder)    Doing well with Adderall no change to dose      MDD (major depressive disorder) - Primary    Doing well on Wellbutrin no changes to medication.      Tobacco use disorder    Discussed tobacco cessation.  She has tried Nicorette gum this actually caused some side  effects.  She smokes such minimally but I think she can just put them down altogether or use gum or mints as an alternative when she does reach for cigarette.         Note: This dictation was prepared with Dragon dictation along with smaller phrase technology. Any transcriptional errors that result from this process are unintentional.

## 2017-08-27 ENCOUNTER — Other Ambulatory Visit: Payer: Self-pay | Admitting: Family Medicine

## 2017-08-27 NOTE — Telephone Encounter (Signed)
Ok to refill??  Last office visit 06/06/2017.  Last refill 08/08/2017.

## 2017-09-04 MED FILL — ADDERALL XR 20 MG CAP SA: 20 | 30 days supply | Qty: 30 | Fill #0

## 2017-09-20 DIAGNOSIS — H1033 Unspecified acute conjunctivitis, bilateral: Secondary | ICD-10-CM | POA: Diagnosis not present

## 2017-10-01 ENCOUNTER — Other Ambulatory Visit: Payer: Self-pay | Admitting: Family Medicine

## 2017-10-01 MED ORDER — AMPHETAMINE-DEXTROAMPHET ER 20 MG PO CP24: 20.0000 mg | ORAL_CAPSULE | ORAL | 0 refills | Status: DC

## 2017-10-01 NOTE — Telephone Encounter (Signed)
Ok to refill??  Last office visit 06/06/2017.  Last refill 08/08/2017.

## 2017-10-04 MED FILL — ADDERALL XR 20 MG CAP SA: 20 | 30 days supply | Qty: 30 | Fill #0

## 2017-10-30 ENCOUNTER — Other Ambulatory Visit: Payer: Self-pay | Admitting: Family Medicine

## 2017-10-30 NOTE — Telephone Encounter (Signed)
Ok to refill??  Last office visit 08/08/2017.  Last refill 10/01/2017.

## 2017-10-30 NOTE — Telephone Encounter (Signed)
Need to verify meds in controlled substance database, then can refill

## 2017-10-31 ENCOUNTER — Other Ambulatory Visit: Payer: Self-pay | Admitting: Family Medicine

## 2017-10-31 MED ORDER — AMPHETAMINE-DEXTROAMPHET ER 20 MG PO CP24: 20.0000 mg | ORAL_CAPSULE | ORAL | 0 refills | Status: DC

## 2017-10-31 MED ORDER — AMPHETAMINE-DEXTROAMPHET ER 20 MG PO CP24
20.0000 mg | ORAL_CAPSULE | ORAL | 0 refills | Status: DC
Start: 2017-10-31 — End: 2017-10-31

## 2017-10-31 NOTE — Telephone Encounter (Signed)
Please pull controlled substance database so I can verify and refill

## 2017-10-31 NOTE — Telephone Encounter (Signed)
Confirmed in chart in and controlled substance database and refilled

## 2017-10-31 NOTE — Telephone Encounter (Signed)
I do not have access to the registry. Please ask front office to assist with this.

## 2017-11-01 MED FILL — ADDERALL XR 20 MG CAP SA: 20 | 30 days supply | Qty: 30 | Fill #0

## 2017-12-06 ENCOUNTER — Other Ambulatory Visit: Payer: Self-pay | Admitting: Family Medicine

## 2017-12-06 MED ORDER — AMPHETAMINE-DEXTROAMPHET ER 20 MG PO CP24: 20.0000 mg | ORAL_CAPSULE | ORAL | 0 refills | Status: DC

## 2017-12-06 MED FILL — ADDERALL XR 20 MG CAP SA: 20 | 30 days supply | Qty: 30 | Fill #0

## 2017-12-06 NOTE — Telephone Encounter (Signed)
Ok to refill??  Last office visit 08/08/2017  Last refill 10/31/2017.

## 2018-01-08 ENCOUNTER — Other Ambulatory Visit: Payer: Self-pay | Admitting: Family Medicine

## 2018-01-08 ENCOUNTER — Encounter: Payer: Self-pay | Admitting: Family Medicine

## 2018-01-08 MED ORDER — AMPHETAMINE-DEXTROAMPHET ER 20 MG PO CP24: 20.0000 mg | ORAL_CAPSULE | ORAL | 0 refills | Status: DC

## 2018-01-08 MED ORDER — BUPROPION HCL ER (XL) 300 MG PO TB24: 300.0000 mg | ORAL_TABLET | Freq: Every day | ORAL | 1 refills | Status: DC

## 2018-01-08 MED FILL — BuPROPion HCL ER (XL) 300 M: 300 | 90 days supply | Qty: 90 | Fill #0

## 2018-01-08 NOTE — Telephone Encounter (Signed)
Ok to refill??  Last office visit 08/08/2017.  Last refill 12/06/2017.

## 2018-01-09 MED FILL — ADDERALL XR 20 MG CAP SA: 20 | 30 days supply | Qty: 30 | Fill #0

## 2018-02-06 ENCOUNTER — Other Ambulatory Visit: Payer: Self-pay | Admitting: Family Medicine

## 2018-02-07 NOTE — Telephone Encounter (Signed)
Ok to refill??  Last office visit 08/08/2017.  Last refill 11/19/52019.

## 2018-02-08 MED ORDER — AMPHETAMINE-DEXTROAMPHET ER 20 MG PO CP24: 20.0000 mg | ORAL_CAPSULE | ORAL | 0 refills | Status: DC

## 2018-02-08 MED FILL — ADDERALL XR 20 MG CAP SA: 20 | 30 days supply | Qty: 30 | Fill #0

## 2018-02-15 ENCOUNTER — Other Ambulatory Visit: Payer: Self-pay

## 2018-02-15 ENCOUNTER — Encounter: Payer: Self-pay | Admitting: Family Medicine

## 2018-02-15 ENCOUNTER — Ambulatory Visit (INDEPENDENT_AMBULATORY_CARE_PROVIDER_SITE_OTHER): Payer: 59 | Admitting: Family Medicine

## 2018-02-15 VITALS — BP 118/62 | HR 64 | Temp 98.3°F | Resp 14 | Ht 65.5 in | Wt 140.0 lb

## 2018-02-15 DIAGNOSIS — F988 Other specified behavioral and emotional disorders with onset usually occurring in childhood and adolescence: Secondary | ICD-10-CM

## 2018-02-15 DIAGNOSIS — F330 Major depressive disorder, recurrent, mild: Secondary | ICD-10-CM

## 2018-02-15 DIAGNOSIS — Z0000 Encounter for general adult medical examination without abnormal findings: Secondary | ICD-10-CM | POA: Diagnosis not present

## 2018-02-15 DIAGNOSIS — F17200 Nicotine dependence, unspecified, uncomplicated: Secondary | ICD-10-CM

## 2018-02-15 LAB — COMPREHENSIVE METABOLIC PANEL
AG Ratio: 2 (calc) (ref 1.0–2.5)
ALT: 15 U/L (ref 6–29)
AST: 19 U/L (ref 10–30)
Albumin: 4.2 g/dL (ref 3.6–5.1)
Alkaline phosphatase (APISO): 51 U/L (ref 33–115)
BUN: 8 mg/dL (ref 7–25)
CHLORIDE: 104 mmol/L (ref 98–110)
CO2: 25 mmol/L (ref 20–32)
CREATININE: 0.92 mg/dL (ref 0.50–1.10)
Calcium: 9.5 mg/dL (ref 8.6–10.2)
GLOBULIN: 2.1 g/dL (ref 1.9–3.7)
GLUCOSE: 85 mg/dL (ref 65–99)
Potassium: 3.8 mmol/L (ref 3.5–5.3)
SODIUM: 138 mmol/L (ref 135–146)
TOTAL PROTEIN: 6.3 g/dL (ref 6.1–8.1)
Total Bilirubin: 0.5 mg/dL (ref 0.2–1.2)

## 2018-02-15 LAB — CBC WITH DIFFERENTIAL/PLATELET
Absolute Monocytes: 689 cells/uL (ref 200–950)
BASOS PCT: 0.4 %
Basophils Absolute: 33 cells/uL (ref 0–200)
EOS PCT: 1.2 %
Eosinophils Absolute: 100 cells/uL (ref 15–500)
HCT: 39.6 % (ref 35.0–45.0)
Hemoglobin: 13 g/dL (ref 11.7–15.5)
Lymphs Abs: 2390 cells/uL (ref 850–3900)
MCH: 29.3 pg (ref 27.0–33.0)
MCHC: 32.8 g/dL (ref 32.0–36.0)
MCV: 89.2 fL (ref 80.0–100.0)
MONOS PCT: 8.3 %
MPV: 10.5 fL (ref 7.5–12.5)
NEUTROS ABS: 5088 {cells}/uL (ref 1500–7800)
Neutrophils Relative %: 61.3 %
Platelets: 291 10*3/uL (ref 140–400)
RBC: 4.44 10*6/uL (ref 3.80–5.10)
RDW: 11.7 % (ref 11.0–15.0)
Total Lymphocyte: 28.8 %
WBC: 8.3 10*3/uL (ref 3.8–10.8)

## 2018-02-15 LAB — LIPID PANEL
Cholesterol: 198 mg/dL (ref <200)
HDL: 68 mg/dL (ref >50)
LDL Cholesterol (Calc): 114 mg/dL (calc) — ABNORMAL HIGH
Non-HDL Cholesterol (Calc): 130 mg/dL (calc) — ABNORMAL HIGH (ref <130)
Total CHOL/HDL Ratio: 2.9 (calc) (ref <5.0)
Triglycerides: 70 mg/dL (ref <150)

## 2018-02-15 LAB — TSH: TSH: 1.37 m[IU]/L

## 2018-02-15 NOTE — Assessment & Plan Note (Signed)
Doing well on wellbutrin, no changes She does continue to smoke but has not increased, not ready to quit entirely

## 2018-02-15 NOTE — Assessment & Plan Note (Signed)
Doing well on adderall no change to dose

## 2018-02-15 NOTE — Progress Notes (Signed)
   Subjective:    Patient ID: Deanna Calderon, female    DOB: Feb 15, 1976, 42 y.o.   MRN: [id number]  Patient presents for Annual Exam (is fasting)   Pt here for CPE   Medications and history reviewed     ADHD- taking adderrall as prescribed, no concerns, doing well in work setting continues to benefit , helps her concentrate and stay on task   MDD- taking wellbutrin as prescribed , doing well on depression medicatio   Tobacco use- smokes 2-3 cig     Menses regular     No concerns    Review Of Systems:  GEN- denies fatigue, fever, weight loss,weakness, recent illness HEENT- denies eye drainage, change in vision, nasal discharge, CVS- denies chest pain, palpitations RESP- denies SOB, cough, wheeze ABD- denies N/V, change in stools, abd pain GU- denies dysuria, hematuria, dribbling, incontinence MSK- denies joint pain, muscle aches, injury Neuro- denies headache, dizziness, syncope, seizure activity       Objective:    BP 118/62   Pulse 64   Temp 98.3 F (36.8 C) (Oral)   Resp 14   Ht 5' 5.5" (1.664 m)   Wt 140 lb (63.5 kg)   LMP 02/07/2018   SpO2 98%   BMI 22.94 kg/m  GEN- NAD, alert and oriented x3 HEENT- PERRL, EOMI, non injected sclera, pink conjunctiva, MMM, oropharynx clear Neck- Supple, no thyromegaly CVS- RRR, no murmur RESP-CTAB ABD-NABS,soft,NT,ND EXT- No edema Pulses- Radial, DP- 2+        Assessment & Plan:      Problem List Items Addressed This Visit      Unprioritized   ADD (attention deficit disorder)    Doing well on adderall no change to dose      MDD (major depressive disorder)    Doing well on wellbutrin, no changes She does continue to smoke but has not increased, not ready to quit entirely      Tobacco use disorder    Other Visit Diagnoses    Routine general medical examination at a health care facility    -  Primary   CPE done, fasting labs, immunizations UTD   Relevant Orders   CBC with Differential/Platelet   Comprehensive metabolic panel   Lipid panel   TSH      Note: This dictation was prepared with Dragon dictation along with smaller phrase technology. Any transcriptional errors that result from this process are unintentional.

## 2018-02-15 NOTE — Patient Instructions (Signed)
F/U 6 months

## 2018-03-18 ENCOUNTER — Other Ambulatory Visit: Payer: Self-pay | Admitting: Family Medicine

## 2018-03-18 MED ORDER — AMPHETAMINE-DEXTROAMPHET ER 20 MG PO CP24: 20.0000 mg | ORAL_CAPSULE | ORAL | 0 refills | Status: DC

## 2018-03-18 NOTE — Telephone Encounter (Signed)
Ok to refill??  Last office visit 02/15/2018.  Last refill 12/09/2017.

## 2018-03-19 MED FILL — ADDERALL XR 20 MG CAP SA: 20 | 30 days supply | Qty: 30 | Fill #0

## 2018-05-02 ENCOUNTER — Other Ambulatory Visit: Payer: Self-pay | Admitting: Family Medicine

## 2018-05-02 MED ORDER — AMPHETAMINE-DEXTROAMPHET ER 20 MG PO CP24: 20.0000 mg | ORAL_CAPSULE | ORAL | 0 refills | Status: DC

## 2018-05-02 MED FILL — ADDERALL XR 20 MG CAP SA: 20 | 30 days supply | Qty: 30 | Fill #0

## 2018-05-02 NOTE — Telephone Encounter (Signed)
Ok to refill??  Last office visit 02/15/2018.  Last refill 03/18/2018.

## 2018-05-30 ENCOUNTER — Other Ambulatory Visit: Payer: Self-pay | Admitting: Family Medicine

## 2018-05-30 NOTE — Telephone Encounter (Signed)
Requested Prescriptions   Pending Prescriptions Disp Refills  . amphetamine-dextroamphetamine (ADDERALL XR) 20 MG 24 hr capsule 30 capsule 0    Sig: Take 1 capsule (20 mg total) by mouth every morning.   Last OV 02/15/2018  Last Rx written 05/02/2018

## 2018-05-31 MED ORDER — AMPHETAMINE-DEXTROAMPHET ER 20 MG PO CP24: 20.0000 mg | ORAL_CAPSULE | ORAL | 0 refills | Status: DC

## 2018-06-04 ENCOUNTER — Encounter: Payer: Self-pay | Admitting: Family Medicine

## 2018-06-04 ENCOUNTER — Telehealth: Payer: 59 | Admitting: Physician Assistant

## 2018-06-04 ENCOUNTER — Other Ambulatory Visit: Payer: Self-pay | Admitting: Family Medicine

## 2018-06-04 DIAGNOSIS — R300 Dysuria: Secondary | ICD-10-CM

## 2018-06-04 MED ORDER — NITROFURANTOIN MONOHYD MACRO 100 MG PO CAPS: 100.0000 mg | ORAL_CAPSULE | Freq: Two times a day (BID) | ORAL | 0 refills | Status: DC

## 2018-06-04 MED ORDER — FLUCONAZOLE 150 MG PO TABS: 150.0000 mg | ORAL_TABLET | Freq: Once | ORAL | 0 refills | Status: DC

## 2018-06-04 MED FILL — NITROFURANTOIN MONO-MCR 100: 100 | 5 days supply | Qty: 10 | Fill #0

## 2018-06-04 MED FILL — ADDERALL XR 20 MG CAP SA: 20 | 30 days supply | Qty: 30 | Fill #0

## 2018-06-04 MED FILL — FLUCONAZOLE 150 MG TABS: 150 | 1 days supply | Qty: 1 | Fill #0

## 2018-06-04 MED FILL — buPROPion HCL ER (XL) 300 M: 300 | 90 days supply | Qty: 90 | Fill #0

## 2018-06-04 NOTE — Progress Notes (Signed)
We are sorry that you are not feeling well.Here is how we plan to help!  Based on what you shared with me it looks like you most likely have a simple urinary tract infection.  A UTI (Urinary Tract Infection) is a bacterial infection of the bladder.  Most cases of urinary tract infections are simple to treat but a key part of your care is to encourage you to drink plenty of fluids and watch your symptoms carefully.  I have prescribed MacroBid 100 mg twice a day for 5 days.Your symptoms should gradually improve. Call us if the burning in your urine worsens, you develop worsening fever, back pain or pelvic pain or if your symptoms do not resolve after completing the antibiotic. I have prescribed Diflucan in the event that you develop a yeast infection.Please do not the diflucan pro phylactically, only if you have symptoms.   Urinary tract infections can be prevented by drinking plenty of water to keep your body hydrated.Also be sure when you wipe, wipe from front to back and don't hold it in!If possible, empty your bladder every 4 hours.  Your e-visit answers were reviewed by a board certified advanced clinical practitioner to complete your personal care plan.Depending on the condition, your plan could have included both over the counter or prescription medications.  If there is a problem please reply once you have received a response from your provider.  Your safety is important to [place].If you have drug allergies check your prescription carefully.  You can use MyChart to ask questions about today's visit, request a non-urgent call back, or ask for a work or school excuse for 24 hours related to this e-Visit. If it has been greater than 24 hours you will need to follow up with your provider, or enter a new e-Visit to address those concerns.   You will get an e-mail in the next two days asking about your experience.I hope that your e-visit has been valuable and will speed your  recovery. Thank you for using e-visits.     ----- Message -----  From: Deanna Calderon  Sent: 4/14/20202:29 PM EDT  To: E-Visit Mailing List Subject: E-Visit Submission: Urinary Problems  E-Visit Submission: Urinary Problems --------------------------------  Question: Which of the following are you experiencing? Answer: Pain while passing urine  Change in urine appearance or smell  Question: When you have pain when passing urine, which of these apply? Answer: I have a burning sensation  Question: Are you able to pass urine? Answer: Yes, I can pass urine.  Question: How long have you had pain or difficulty passing urine? Answer: Two days or less  Question: Do you have a fever? Answer: No, I do not have a fever  Question: Do you have any of the following? Answer: I have none of these problems  Question: Do you have an exaggerated sensation of the need to pass urine? Answer: Yes, the sensation is exaggerated  Question: Do you have the urge to urinate more of less frequently than normal? Answer: More frequently  Question: What does your urine look like? Answer: It is cloudy  Question: Do you have any of the following? Answer: An unusual smell  Question: Do you have any of the following? Answer: No discharge  Question: Do you have any sores on your genitals? Answer: No  Question: Do you have any history of kidney dysfunction or kidney problems? Answer: No  Question: Within the past 3 months, have you had any surgery on your kidneys or  bladder, or have you had a tube inserted to collect your urine? Answer: No, I have never had either  Question: Have you had similar symptoms in the past? Answer: Yes, I have had similar symptoms more than once before  Question: If you had similar symptoms in the past, did any of the following work? Answer: Pills for urine infection  Pills for yeast  infection  Question: Please list any additional comments  Answer: If an antibiotic is prescribed I will need diflucan prescribed as well.  Question: Please list your medication allergies that you may have ? (If 'none' , please list as 'none') Answer: None  Question: Are you pregnant? Answer: I am confident that I am not pregnant  Question: Are you breastfeeding? Answer: No  A total of 5-10 minutes was spent evaluating this patients questionnaire and formulating a plan of care.

## 2018-06-04 NOTE — Telephone Encounter (Signed)
Ok to refill?   medication was sent to [facility], but needs to go to [facility].

## 2018-06-05 MED ORDER — BUPROPION HCL ER (XL) 300 MG PO TB24: 300.0000 mg | ORAL_TABLET | Freq: Every day | ORAL | 1 refills | Status: DC

## 2018-07-01 ENCOUNTER — Other Ambulatory Visit: Payer: Self-pay | Admitting: Family Medicine

## 2018-07-01 MED ORDER — AMPHETAMINE-DEXTROAMPHET ER 20 MG PO CP24: 20.0000 mg | ORAL_CAPSULE | Freq: Every day | ORAL | 0 refills | Status: DC

## 2018-07-01 MED FILL — ADDERALL XR 20 MG CAP SA: 20 | 30 days supply | Qty: 30 | Fill #0

## 2018-07-01 NOTE — Telephone Encounter (Signed)
Ok to refill??  Last office visit 02/15/2018.   Last refill 06/03/2018.

## 2018-07-17 DIAGNOSIS — D229 Melanocytic nevi, unspecified: Secondary | ICD-10-CM

## 2018-07-31 ENCOUNTER — Other Ambulatory Visit: Payer: Self-pay | Admitting: Family Medicine

## 2018-07-31 MED ORDER — AMPHETAMINE-DEXTROAMPHET ER 20 MG PO CP24: 20.0000 mg | ORAL_CAPSULE | Freq: Every day | ORAL | 0 refills | Status: DC

## 2018-07-31 MED FILL — ADDERALL XR 20 MG CAP SA: 20 | 30 days supply | Qty: 30 | Fill #0

## 2018-07-31 NOTE — Telephone Encounter (Signed)
Ok to refill??  Last office visit 02/15/2018.  Last refill 07/01/2018.

## 2018-08-19 ENCOUNTER — Ambulatory Visit: Payer: 59 | Admitting: Family Medicine

## 2018-08-27 ENCOUNTER — Other Ambulatory Visit: Payer: Self-pay | Admitting: Family Medicine

## 2018-08-27 MED ORDER — AMPHETAMINE-DEXTROAMPHET ER 20 MG PO CP24: 20.0000 mg | ORAL_CAPSULE | Freq: Every day | ORAL | 0 refills | Status: DC

## 2018-08-27 MED FILL — ADDERALL XR 20 MG CAP SA: 20 | 30 days supply | Qty: 30 | Fill #0

## 2018-08-27 NOTE — Telephone Encounter (Signed)
Pt requesting refill on Adderall      LOV: 02/15/18  LRF:   07/31/18

## 2018-09-26 ENCOUNTER — Other Ambulatory Visit: Payer: Self-pay | Admitting: Family Medicine

## 2018-09-26 NOTE — Telephone Encounter (Signed)
Ok to refill??  Last office visit 02/15/2018.  Last refill 08/27/2018.

## 2018-09-27 MED ORDER — AMPHETAMINE-DEXTROAMPHET ER 20 MG PO CP24: 20.0000 mg | ORAL_CAPSULE | Freq: Every day | ORAL | 0 refills | Status: DC

## 2018-09-27 MED FILL — ADDERALL XR 20 MG CAP SA: 20 | 30 days supply | Qty: 30 | Fill #0

## 2018-09-27 NOTE — Telephone Encounter (Signed)
Needs OV to f/u meds

## 2018-10-17 IMAGING — US US PELVIS COMPLETE TRANSABD/TRANSVAG
1 series · 13 of 25 positions shown · non-contrast
Comparison: None.

CLINICAL DATA: Abdominal pain, spotting for 3 days



[Series 1: us pelvis complete transabd/transvag · 0.24mm/px · 13 of 96 slices shown]
[im 1/96]
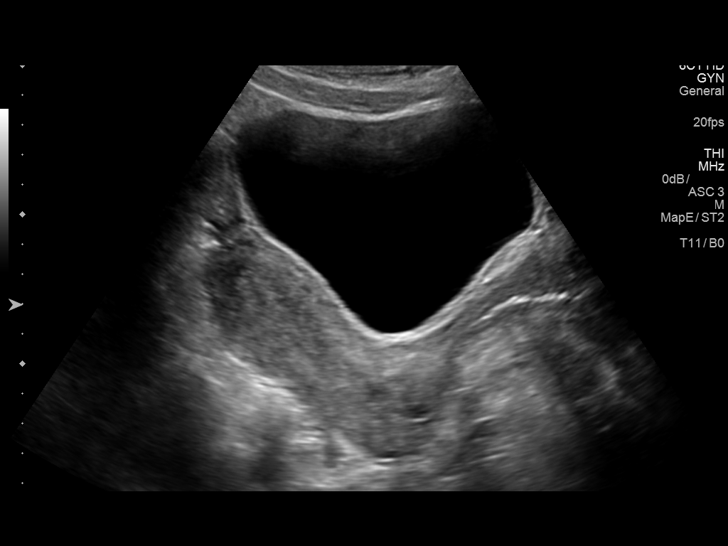
[im 8/96]
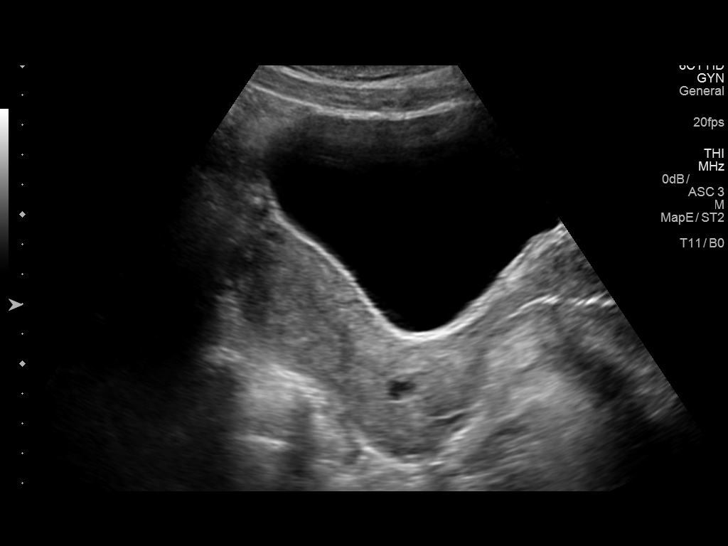
[im 16/96]
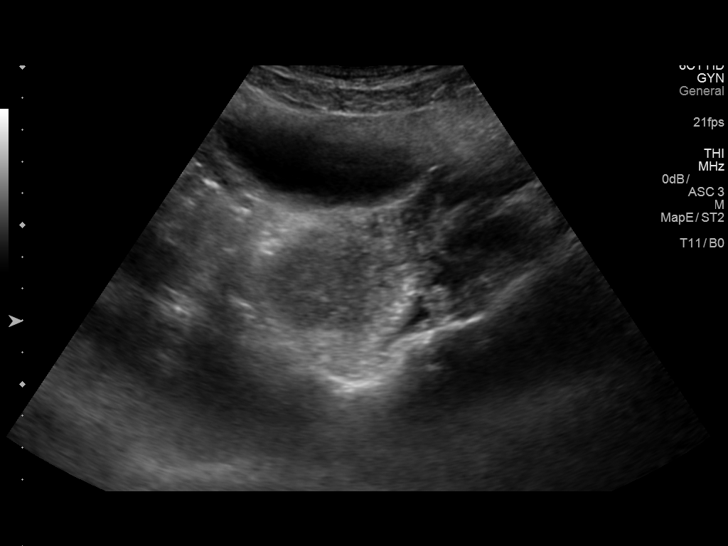
[im 24/96]
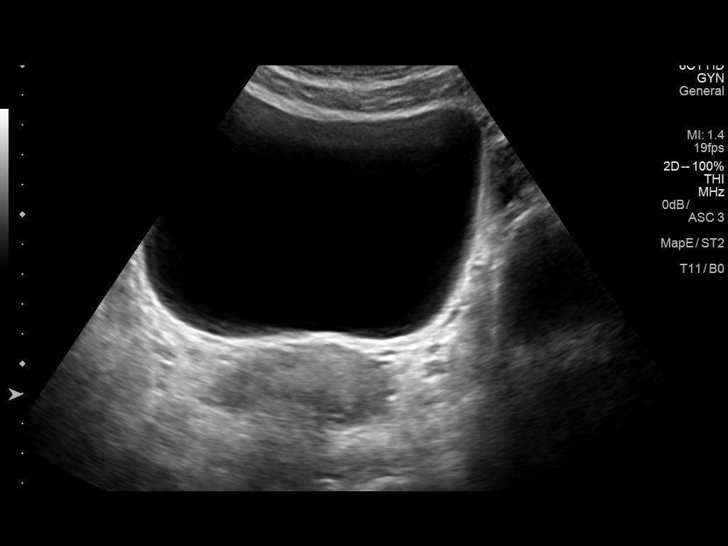
[im 32/96]
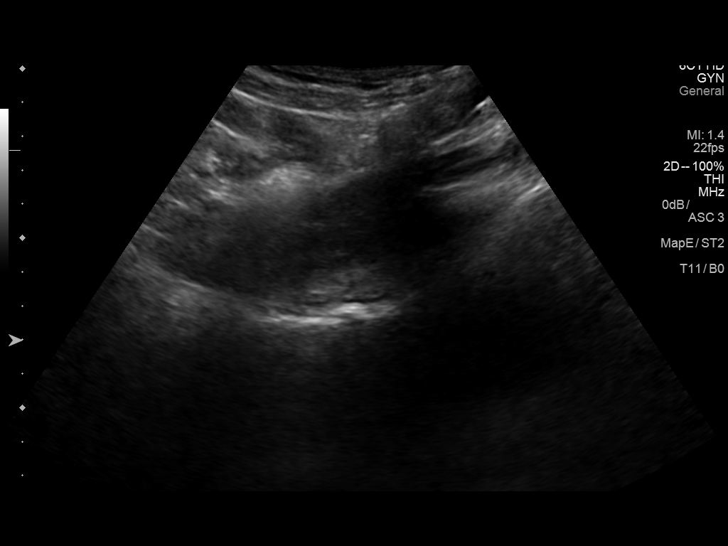
[im 40/96]
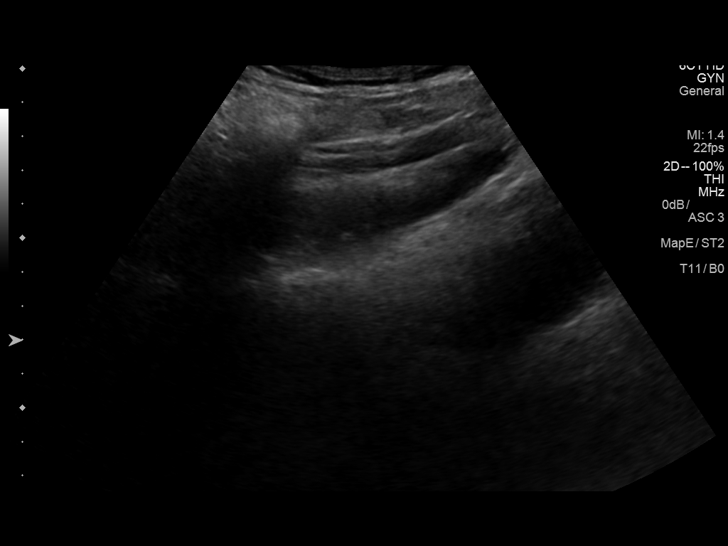
[im 48/96]
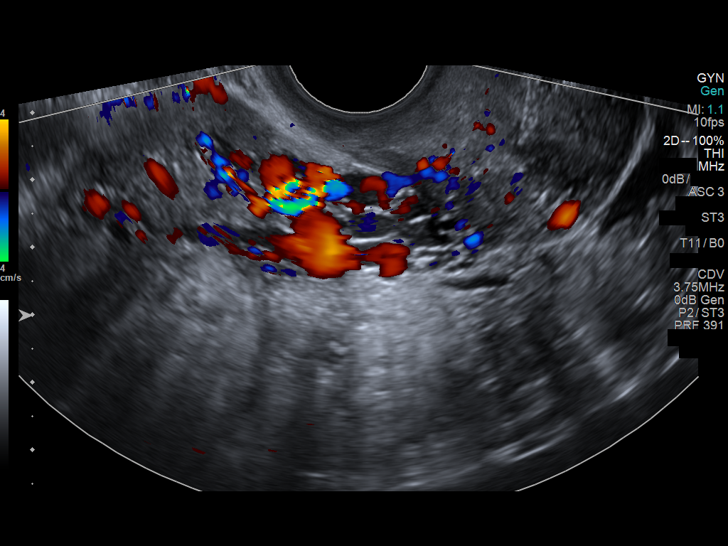
[im 56/96]
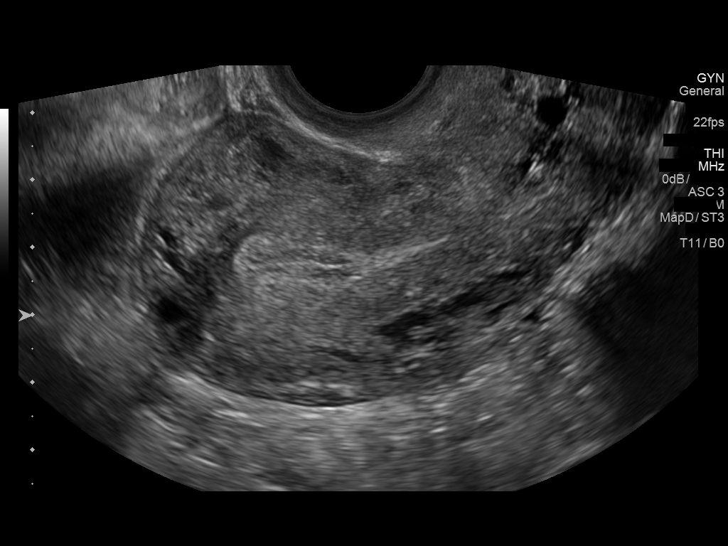
[im 64/96]
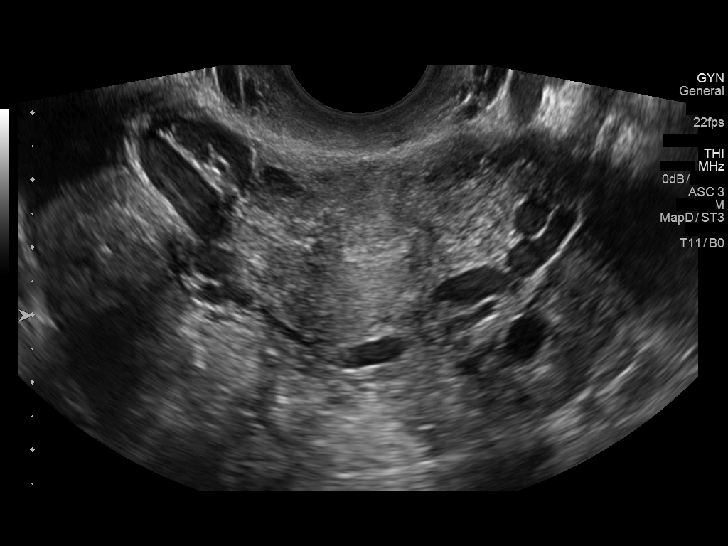
[im 72/96]
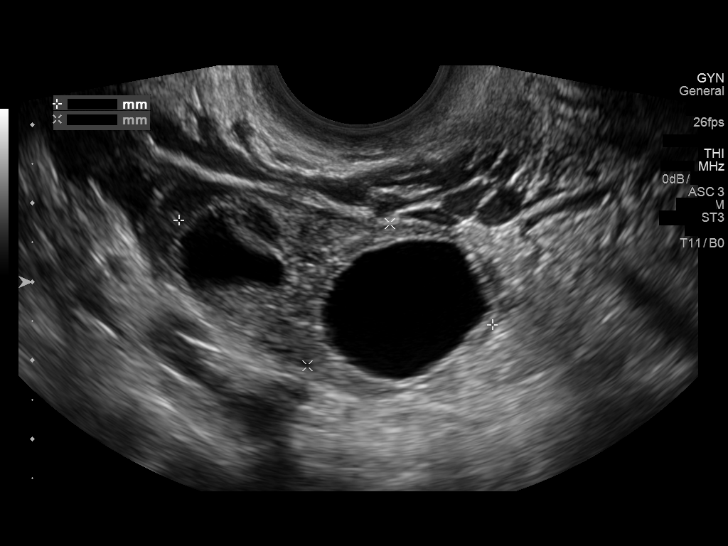
[im 80/96]
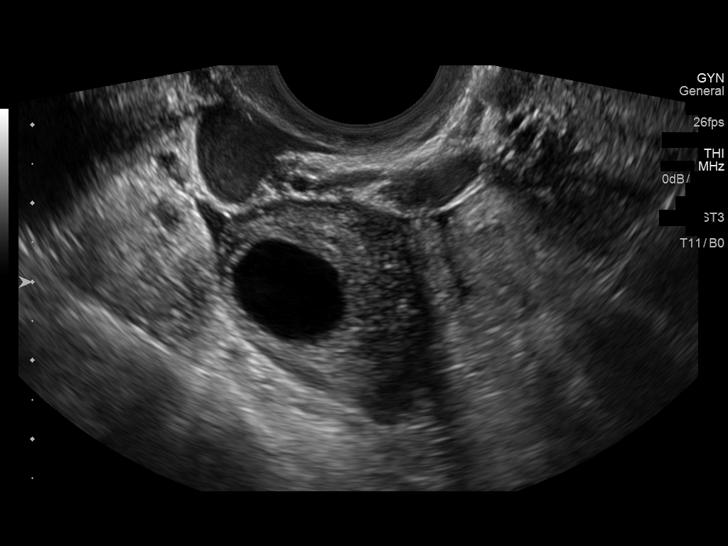
[im 88/96]
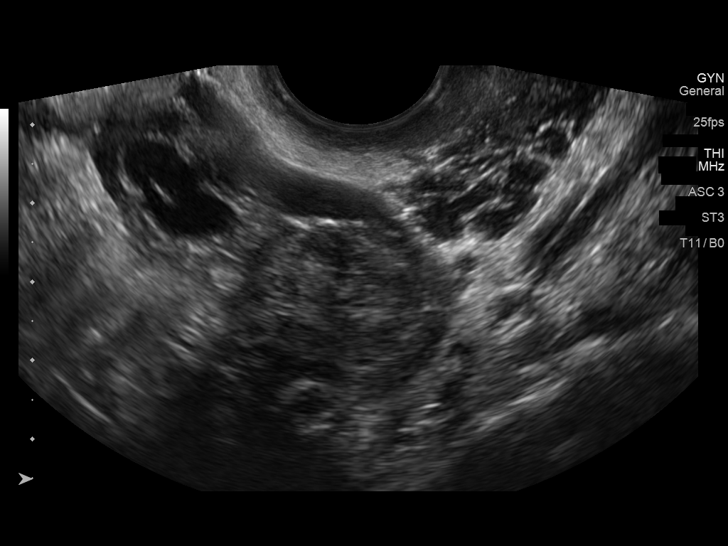
[im 96/96]
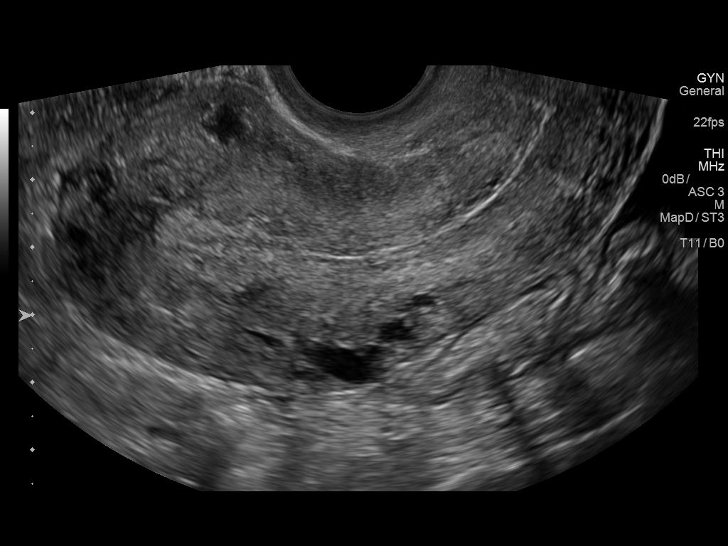

[13 of 25 positions shown; findings below may reference images not displayed]

FINDINGS: Uterus

Measurements: 10 x 3.7 x 4.6 cm. No fibroids or other mass
visualized.

Endometrium

Thickness: 4.8 mm.  No focal abnormality visualized.

Right ovary

Measurements: 4.2 x 2.1 x 2.5 cm. Normal appearance/no adnexal mass.

Left ovary

Measurements: 3.9 x 1.6 x 2.6 cm. Normal appearance/no adnexal mass.

Pulsed Doppler evaluation of both ovaries demonstrates normal
low-resistance arterial and venous waveforms.

Other findings

No abnormal free fluid. Prominent vascularity around the uterus as
can be seen with pelvic congestion syndrome.
IMPRESSION: 1. No ovarian torsion.
2. No focal abnormality of the uterus or ovaries.

## 2018-10-17 IMAGING — US US ABDOMEN LIMITED
1 series · 14 of 25 positions shown · non-contrast
Comparison: None.

CLINICAL DATA: Acute right upper quadrant abdominal pain.

EXAM:
ULTRASOUND ABDOMEN LIMITED RIGHT UPPER QUADRANT

[Series 1: us abdomen limited · 0.15mm/px · 14 of 66 slices shown]
[im 1/66]
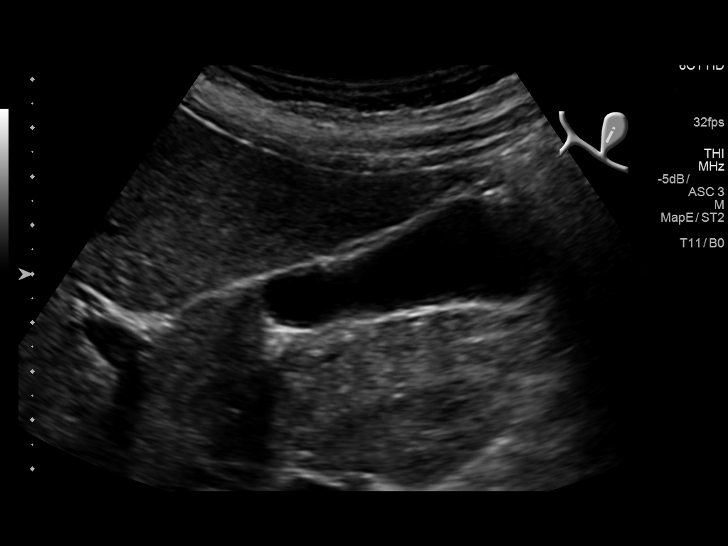
[im 6/66]
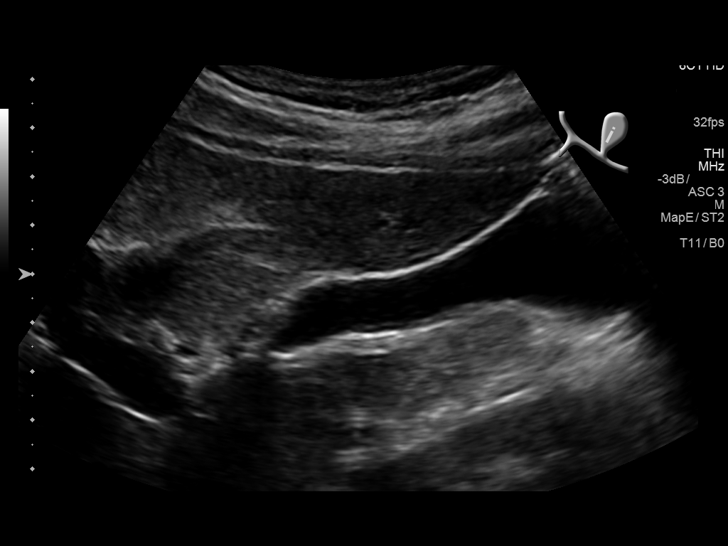
[im 11/66]
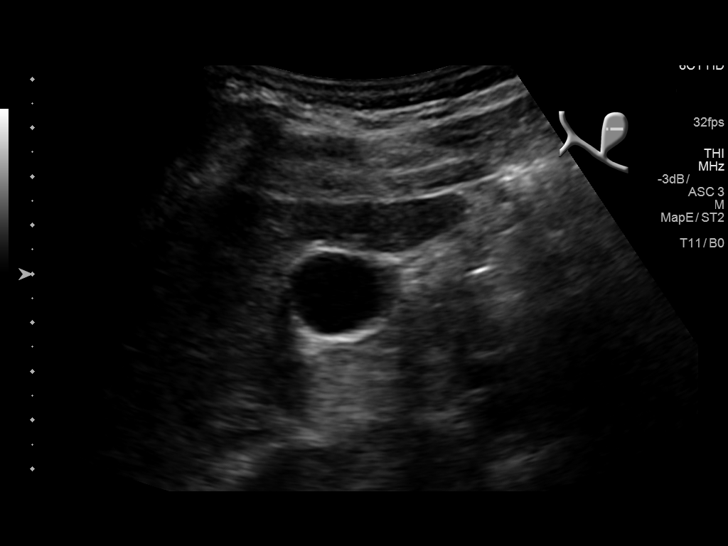
[im 17/66]
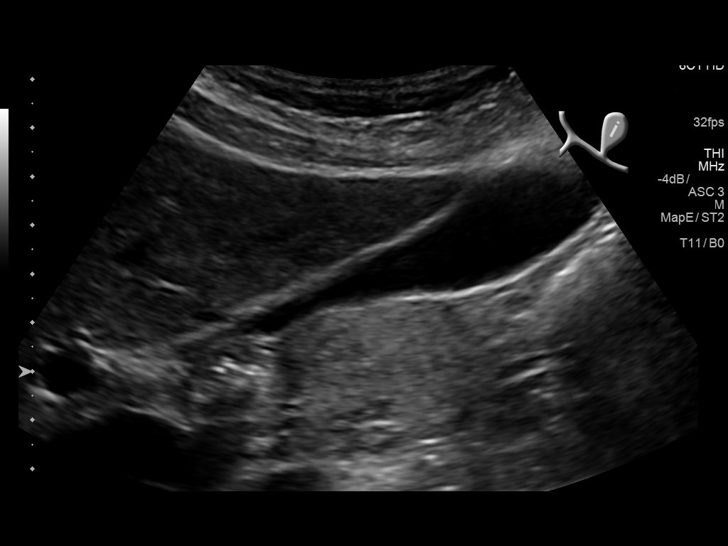
[im 22/66]
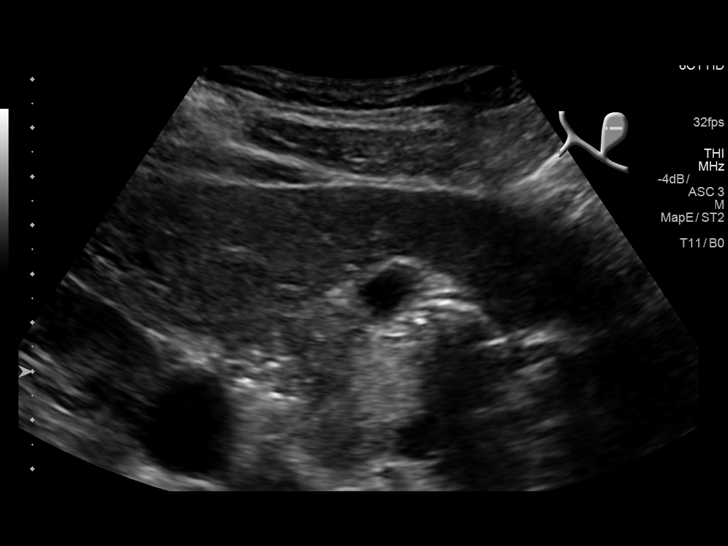
[im 25/66]
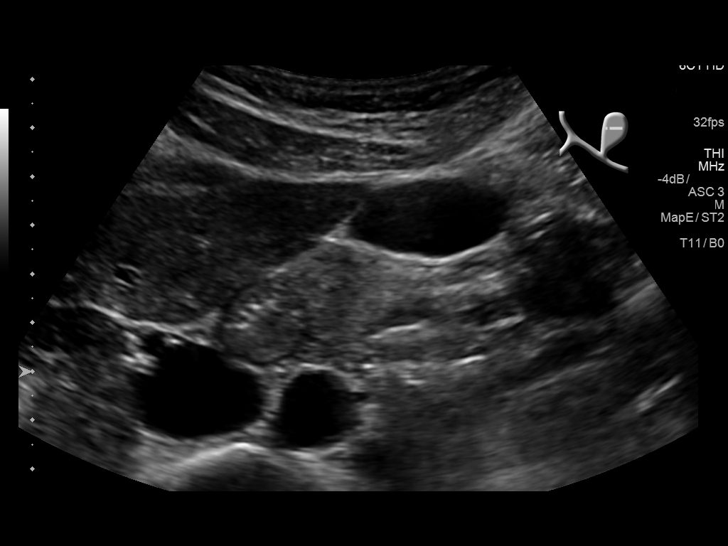
[im 30/66]
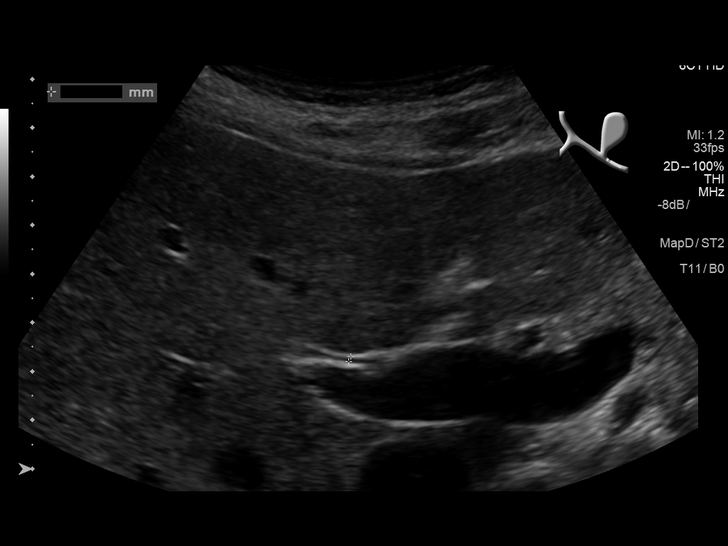
[im 36/66]
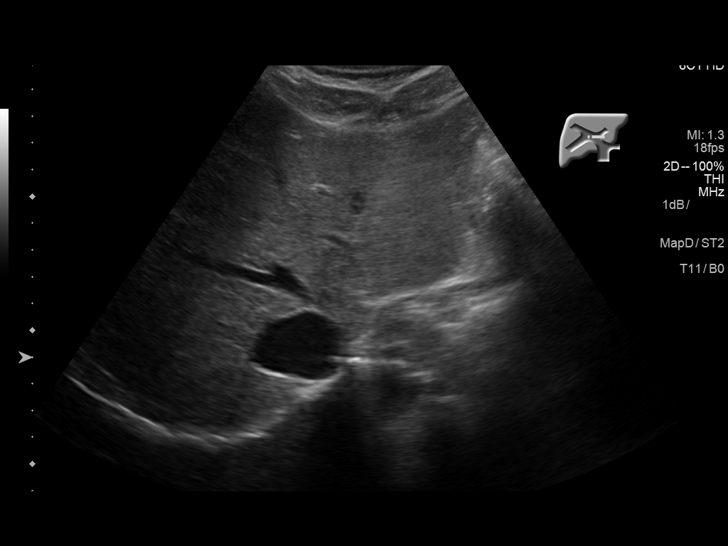
[im 41/66]
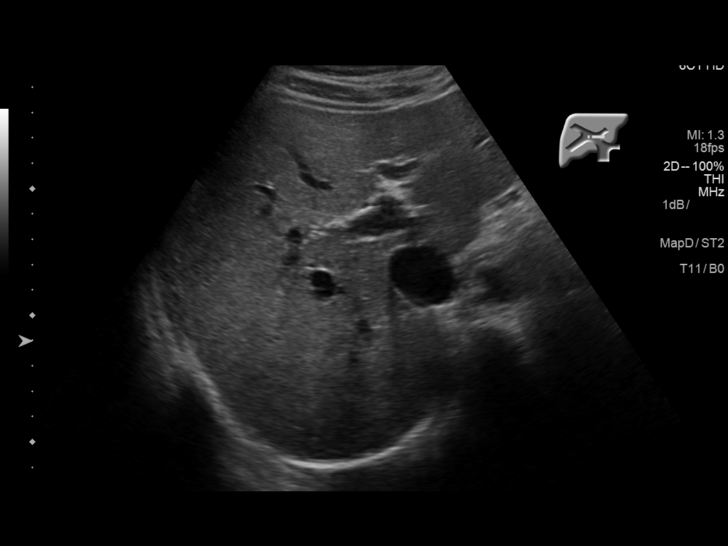
[im 44/66]
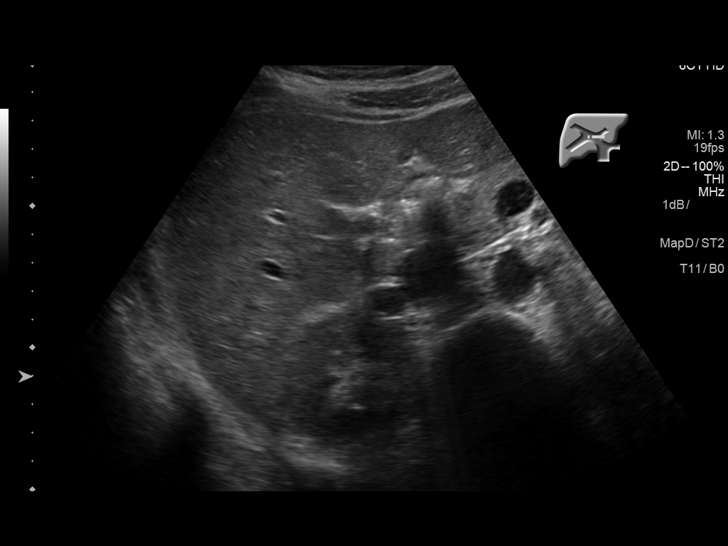
[im 49/66]
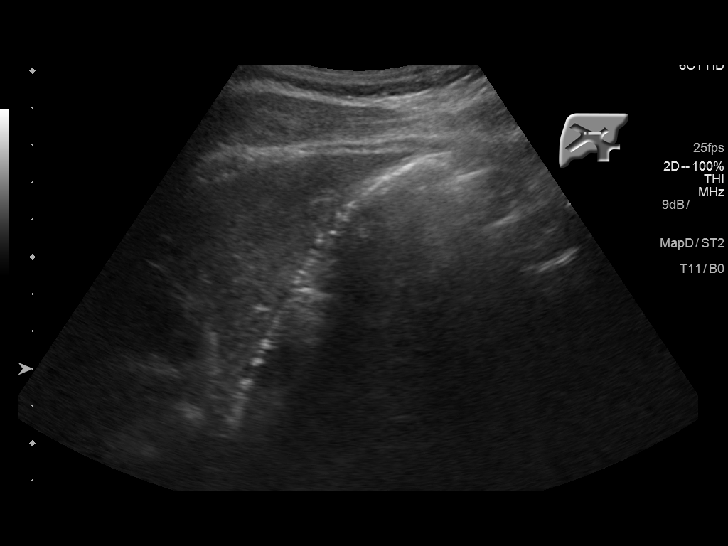
[im 55/66]
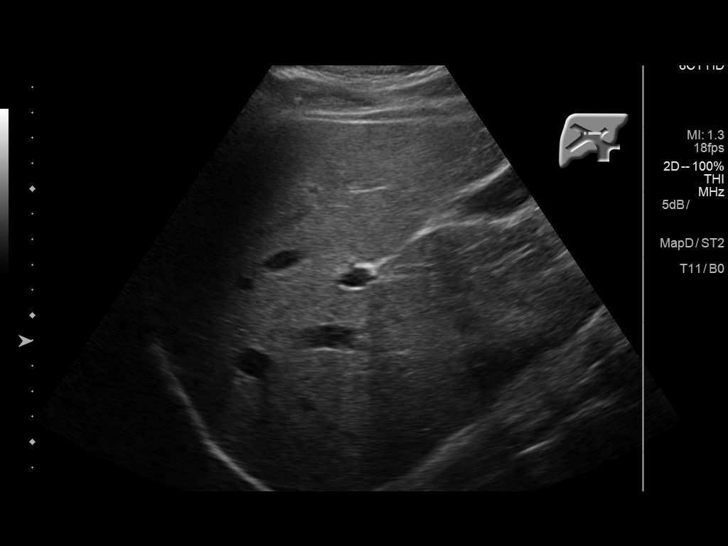
[im 60/66]
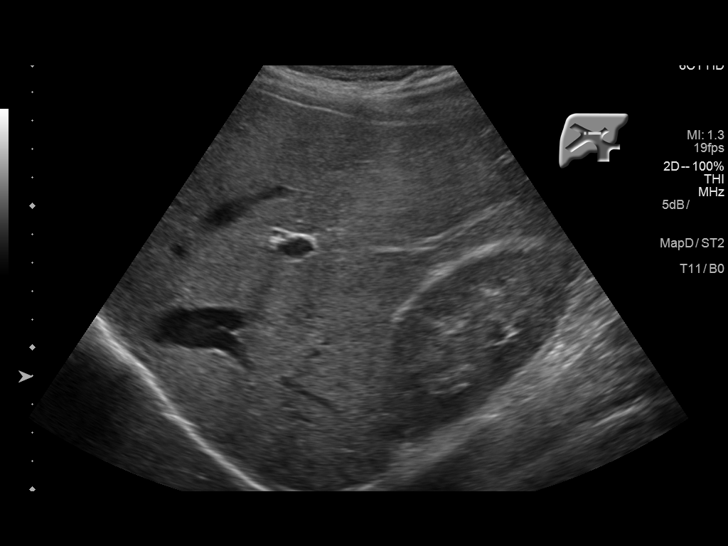
[im 66/66]
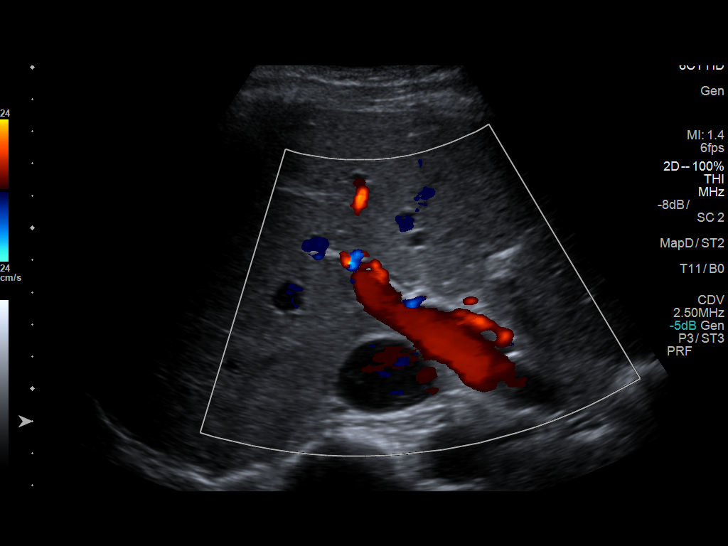

[14 of 25 positions shown; findings below may reference images not displayed]

FINDINGS: Gallbladder:

No gallstones or wall thickening visualized. No sonographic Murphy
sign noted by sonographer.

Common bile duct:

Diameter: 1.1 mm which is within normal limits.

Liver:

No focal lesion identified. Within normal limits in parenchymal
echogenicity. Portal vein is patent on color Doppler imaging with
normal direction of blood flow towards the liver.
IMPRESSION: No abnormality seen in the right upper quadrant of the abdomen.

## 2018-10-17 IMAGING — US US RENAL
1 series · 14 of 25 positions shown · non-contrast
Comparison: None.

CLINICAL DATA: Acute abdominal pain.

EXAM:
RENAL / URINARY TRACT ULTRASOUND COMPLETE

[Series 1: us renal · 0.19mm/px · 14 of 49 slices shown]
[im 1/49]
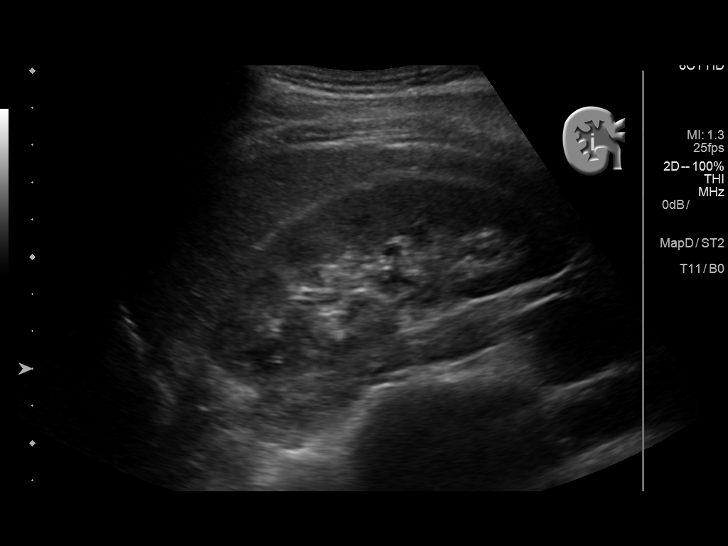
[im 5/49]
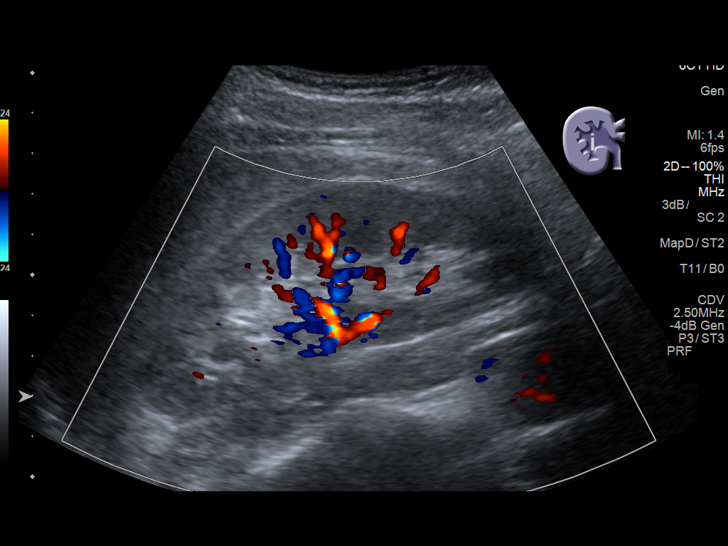
[im 9/49]
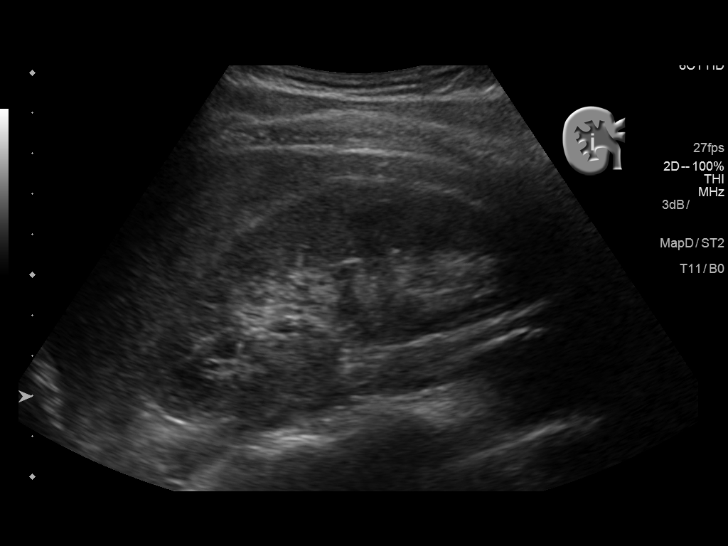
[im 13/49]
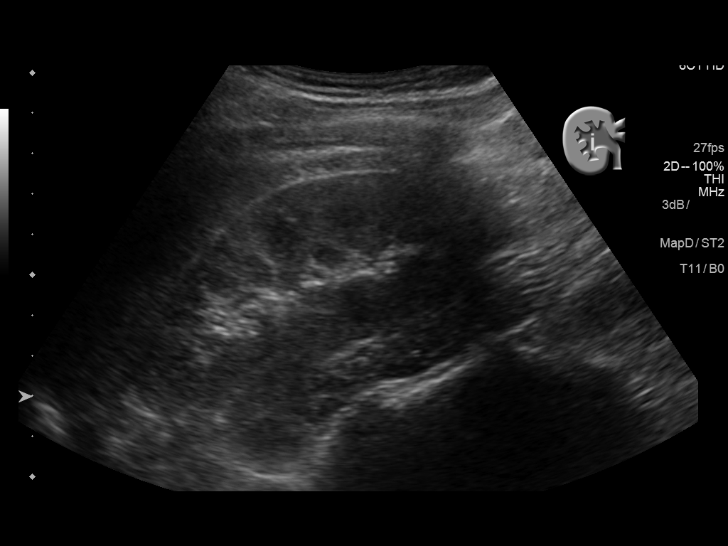
[im 17/49]
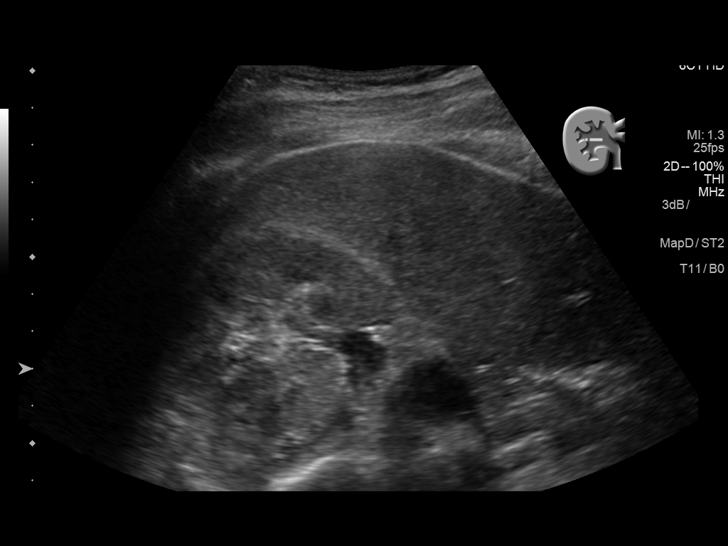
[im 19/49]
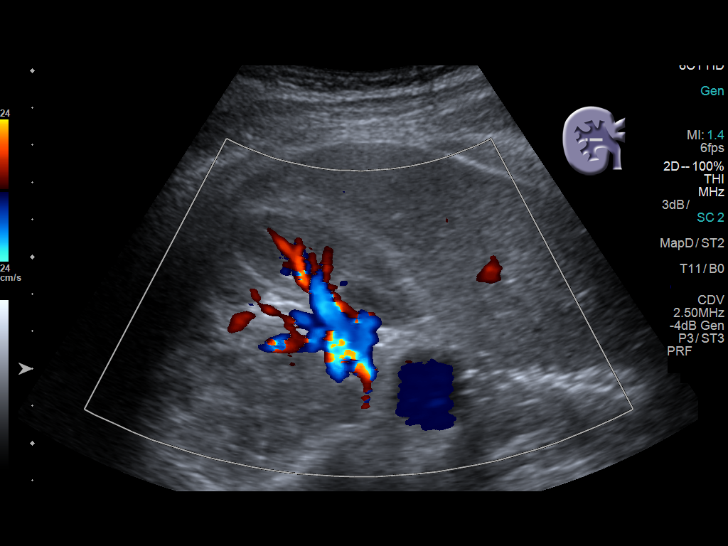
[im 23/49]
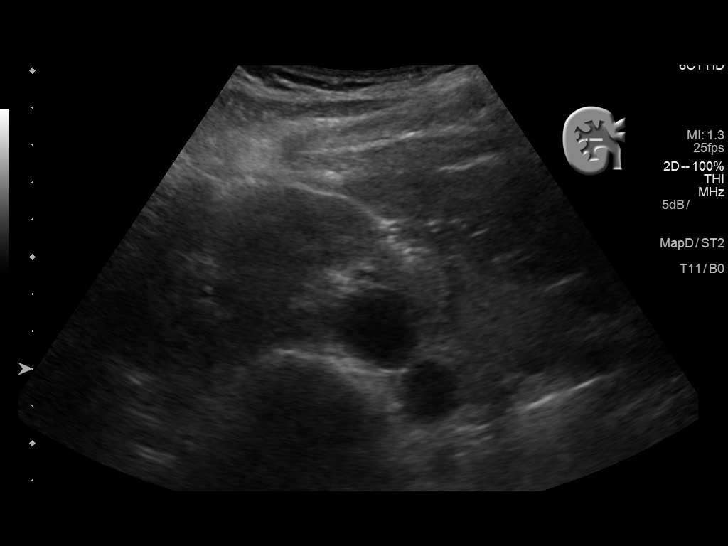
[im 27/49]
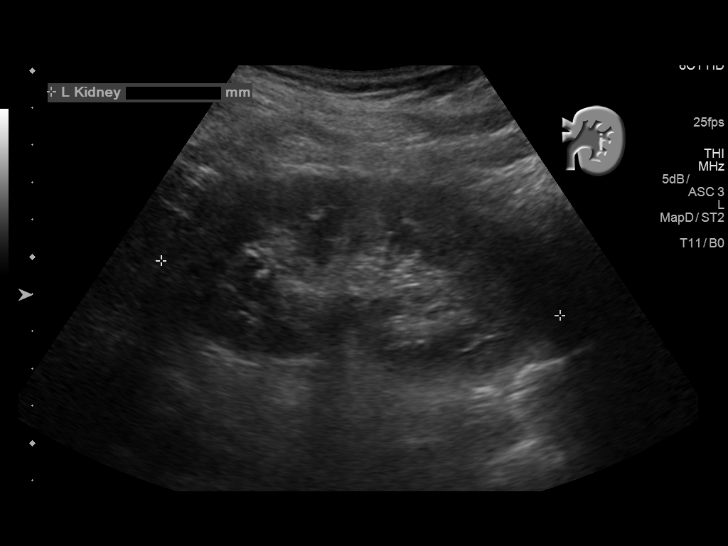
[im 31/49]
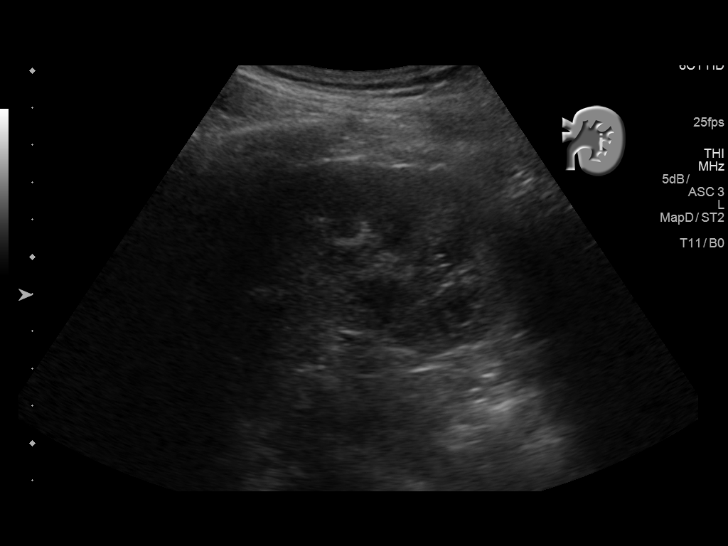
[im 33/49]
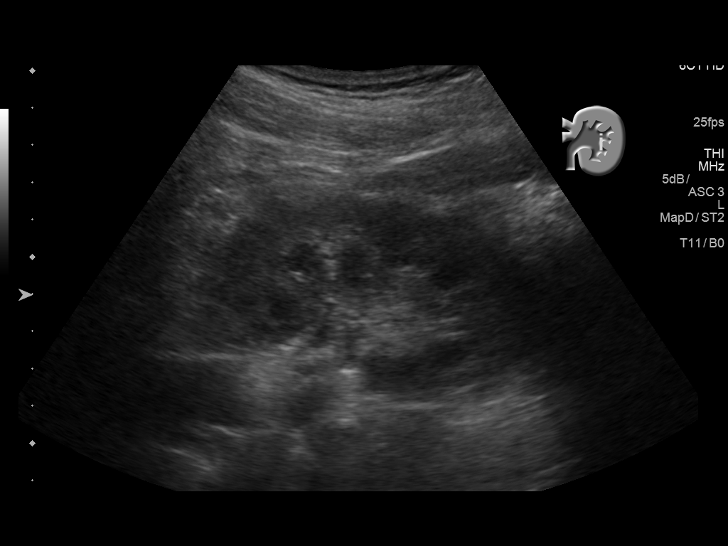
[im 37/49]
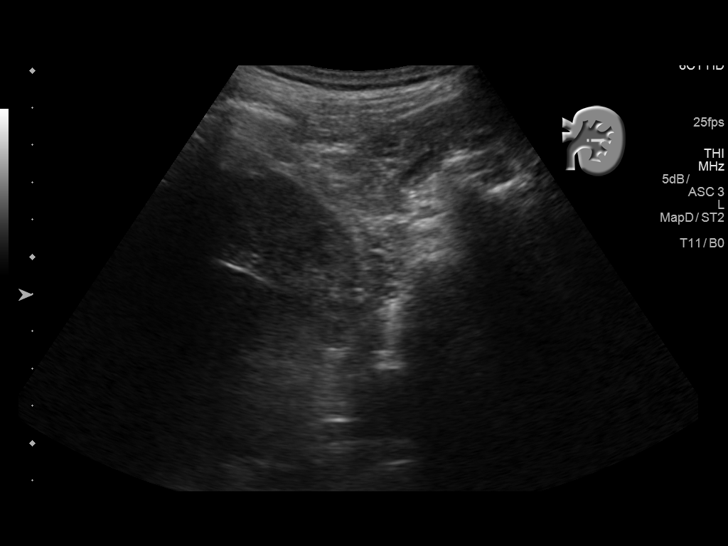
[im 41/49]
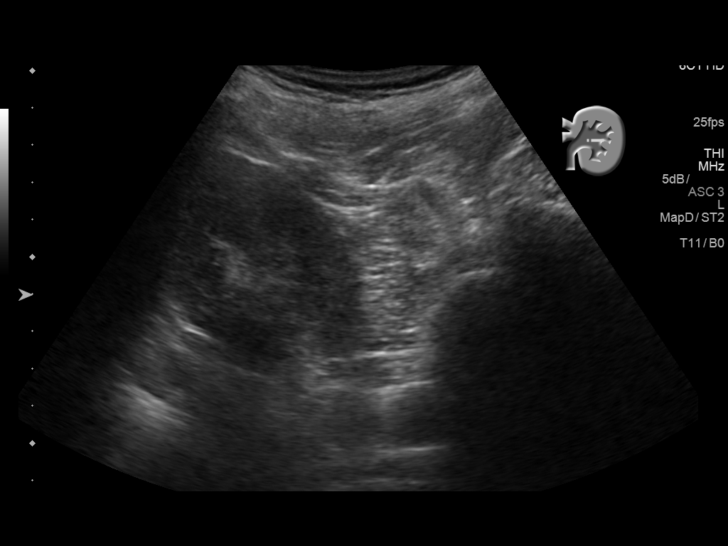
[im 45/49]
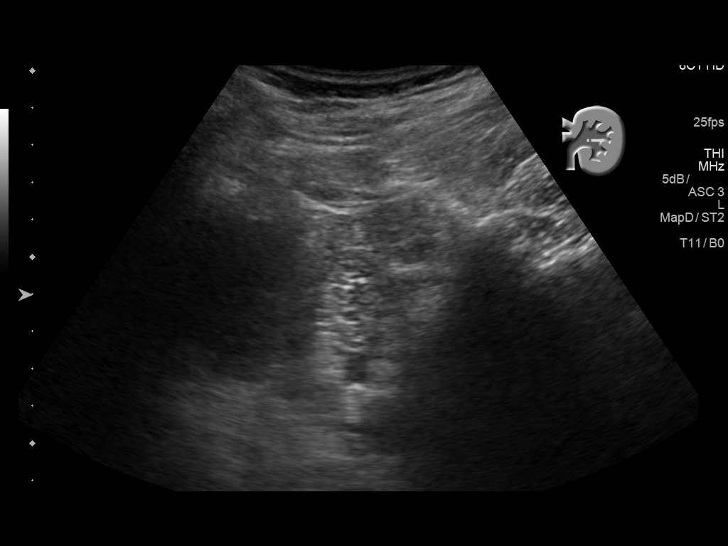
[im 49/49]
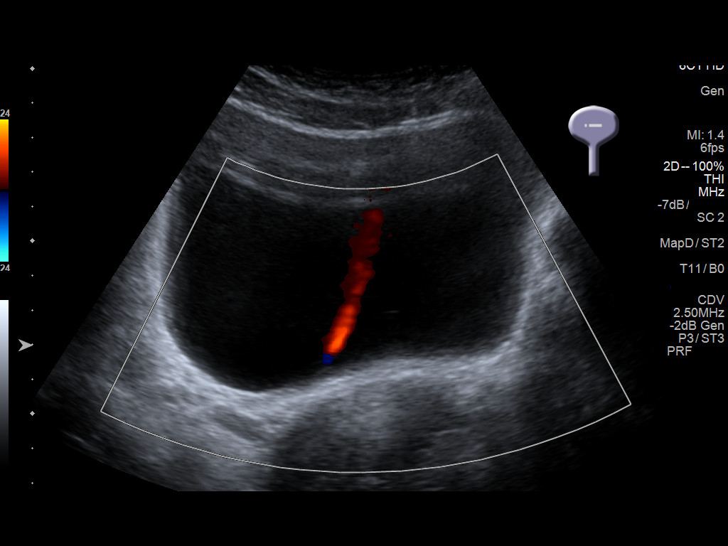

[14 of 25 positions shown; findings below may reference images not displayed]

FINDINGS: Right Kidney:

Length: 10.4 cm. Echogenicity within normal limits. No mass or
hydronephrosis visualized.

Left Kidney:

Length: 10.8 cm. Echogenicity within normal limits. No mass or
hydronephrosis visualized.

Bladder:

Appears normal for degree of bladder distention. Bilateral ureteral
jets are noted.
IMPRESSION: Normal renal ultrasound.

## 2018-10-30 ENCOUNTER — Other Ambulatory Visit: Payer: Self-pay | Admitting: Family Medicine

## 2018-10-31 NOTE — Telephone Encounter (Signed)
Ok to refill??  Last office visit 02/15/2018.  Last refill 09/27/2018.

## 2018-11-01 ENCOUNTER — Other Ambulatory Visit: Payer: Self-pay | Admitting: Family Medicine

## 2018-11-01 MED ORDER — AMPHETAMINE-DEXTROAMPHET ER 20 MG PO CP24: 20.0000 mg | ORAL_CAPSULE | Freq: Every day | ORAL | 0 refills | Status: DC

## 2018-11-01 MED FILL — ADDERALL XR 20 MG CAP SA: 20 | 30 days supply | Qty: 30 | Fill #0

## 2018-11-01 NOTE — Telephone Encounter (Signed)
Needs OV.  

## 2018-11-08 ENCOUNTER — Encounter: Payer: Self-pay | Admitting: Family Medicine

## 2018-11-08 ENCOUNTER — Other Ambulatory Visit: Payer: Self-pay

## 2018-11-08 ENCOUNTER — Ambulatory Visit (INDEPENDENT_AMBULATORY_CARE_PROVIDER_SITE_OTHER): Payer: BC Managed Care – PPO | Admitting: Family Medicine

## 2018-11-08 VITALS — BP 116/64 | HR 78 | Temp 98.5°F | Resp 14 | Ht 65.5 in | Wt 139.0 lb

## 2018-11-08 DIAGNOSIS — F330 Major depressive disorder, recurrent, mild: Secondary | ICD-10-CM

## 2018-11-08 DIAGNOSIS — F17200 Nicotine dependence, unspecified, uncomplicated: Secondary | ICD-10-CM | POA: Diagnosis not present

## 2018-11-08 DIAGNOSIS — B001 Herpesviral vesicular dermatitis: Secondary | ICD-10-CM

## 2018-11-08 DIAGNOSIS — F988 Other specified behavioral and emotional disorders with onset usually occurring in childhood and adolescence: Secondary | ICD-10-CM | POA: Diagnosis not present

## 2018-11-08 MED ORDER — BUPROPION HCL ER (XL) 300 MG PO TB24: 300.0000 mg | ORAL_TABLET | Freq: Every day | ORAL | 1 refills | Status: DC

## 2018-11-08 MED ORDER — VALACYCLOVIR HCL 1 G PO TABS: 1000.0000 mg | ORAL_TABLET | Freq: Two times a day (BID) | ORAL | 0 refills | Status: DC

## 2018-11-08 MED ORDER — AMPHETAMINE-DEXTROAMPHET ER 30 MG PO CP24: 30.0000 mg | ORAL_CAPSULE | ORAL | 0 refills | Status: DC

## 2018-11-08 MED FILL — ADDERALL XR 30 MG CAP SA: 30 | 30 days supply | Qty: 30 | Fill #0

## 2018-11-08 NOTE — Assessment & Plan Note (Signed)
Doing well on wellbutrin, no changes

## 2018-11-08 NOTE — Progress Notes (Signed)
   Subjective:    Patient ID: Deanna Calderon, female    DOB: Nov 04, 1975, 43 y.o.   MRN: [id number]  Patient presents for Follow-up (is not fasting)   ADD- would like to try higher dose of Adderall.  She feels that her medicines not quite strong enough to help with her concentration, specially in her work setting.  She denies any side effects with the medication no change in appetite no tachycardia blood pressure is been normal  Depression symptoms controlled with Wellbutrin declines any concerns about this medication.  She is getting a chemical peel anytime she usually has anything done on her face when she gets a sunburn she will get a fever blister would like to have medication on hand in case this occurs    Review Of Systems:  GEN- denies fatigue, fever, weight loss,weakness, recent illness HEENT- denies eye drainage, change in vision, nasal discharge, CVS- denies chest pain, palpitations RESP- denies SOB, cough, wheeze ABD- denies N/V, change in stools, abd pain GU- denies dysuria, hematuria, dribbling, incontinence MSK- denies joint pain, muscle aches, injury Neuro- denies headache, dizziness, syncope, seizure activity       Objective:    BP 116/64   Pulse 78   Temp 98.5 F (36.9 C) (Oral)   Resp 14   Ht 5' 5.5" (1.664 m)   Wt 139 lb (63 kg)   SpO2 99%   BMI 22.78 kg/m  GEN- NAD, alert and oriented x3 CVS- RRR, no murmur RESP-CTAB Psych-normal affect and mood  EXT- No edema Pulses- Radial 2+        Assessment & Plan:      Pt to schedule Mammogram  Problem List Items Addressed This Visit      Unprioritized   ADD (attention deficit disorder)    Increase adderall to 30mg  once a day  She will contact me in a few weeks via mychart to see if this dose is working well for her      history of fever blisters    Given valtrex to have on hand in case she develops one      Relevant Medications   valACYclovir (VALTREX) 1000 MG tablet   MDD (major  depressive disorder) - Primary    Doing well on wellbutrin, no changes       Tobacco use disorder    counseled on tobacco cessation Discussed using gum or low dose nicotine patch         Note: This dictation was prepared with Dragon dictation along with smaller phrase technology. Any transcriptional errors that result from this process are unintentional.

## 2018-11-08 NOTE — Patient Instructions (Addendum)
Schedule your mammogram  F/U Schedule physical after 12/27   Adderall increased to 30mg  once a day  Valtrex - 1000mg  twice a day x 1 day if you get fever blister

## 2018-11-08 NOTE — Assessment & Plan Note (Signed)
Given valtrex to have on hand in case she develops one

## 2018-11-08 NOTE — Assessment & Plan Note (Signed)
Increase adderall to 30mg  once a day  She will contact me in a few weeks via mychart to see if this dose is working well for her

## 2018-11-08 NOTE — Assessment & Plan Note (Signed)
counseled on tobacco cessation Discussed using gum or low dose nicotine patch

## 2018-12-11 ENCOUNTER — Telehealth: Payer: 59 | Admitting: Family

## 2018-12-11 ENCOUNTER — Other Ambulatory Visit: Payer: Self-pay | Admitting: Family Medicine

## 2018-12-11 DIAGNOSIS — B373 Candidiasis of vulva and vagina: Secondary | ICD-10-CM | POA: Diagnosis not present

## 2018-12-11 DIAGNOSIS — B3731 Acute candidiasis of vulva and vagina: Secondary | ICD-10-CM

## 2018-12-11 MED ORDER — VALACYCLOVIR HCL 1 G PO TABS: 1000.0000 mg | ORAL_TABLET | Freq: Two times a day (BID) | ORAL | 0 refills | Status: DC

## 2018-12-11 MED ORDER — FLUCONAZOLE 150 MG PO TABS: 150.0000 mg | ORAL_TABLET | ORAL | 0 refills | Status: DC | PRN

## 2018-12-11 MED ORDER — AMPHETAMINE-DEXTROAMPHET ER 30 MG PO CP24: 30.0000 mg | ORAL_CAPSULE | ORAL | 0 refills | Status: DC

## 2018-12-11 MED FILL — ADDERALL XR 30 MG CAP SA: 30 | 30 days supply | Qty: 30 | Fill #0

## 2018-12-11 MED FILL — valACYclovir HCL 1 GM TABS: 1 | 7 days supply | Qty: 14 | Fill #0

## 2018-12-11 NOTE — Progress Notes (Signed)

## 2018-12-11 NOTE — Telephone Encounter (Signed)
Ok to refill??  Last office visit/ refill 11/08/2018.

## 2018-12-18 MED FILL — ADDERALL XR 30 MG CAP SA: 30 | 30 days supply | Qty: 30 | Fill #0

## 2019-01-27 ENCOUNTER — Other Ambulatory Visit: Payer: Self-pay | Admitting: Family Medicine

## 2019-01-27 MED ORDER — AMPHETAMINE-DEXTROAMPHET ER 30 MG PO CP24: 30.0000 mg | ORAL_CAPSULE | ORAL | 0 refills | Status: DC

## 2019-01-27 MED FILL — ADDERALL XR 30 MG CAP SA: 30 | 30 days supply | Qty: 30 | Fill #0

## 2019-01-27 NOTE — Telephone Encounter (Signed)
Ok to refill??  Last office visit 11/08/2018.  Last refill 12/11/2018.

## 2019-02-24 ENCOUNTER — Encounter: Payer: BC Managed Care – PPO | Admitting: Family Medicine

## 2019-02-28 ENCOUNTER — Other Ambulatory Visit: Payer: Self-pay | Admitting: Family Medicine

## 2019-03-03 ENCOUNTER — Other Ambulatory Visit: Payer: Self-pay | Admitting: Family Medicine

## 2019-03-03 MED ORDER — AMPHETAMINE-DEXTROAMPHET ER 30 MG PO CP24: 30.0000 mg | ORAL_CAPSULE | ORAL | 0 refills | Status: DC

## 2019-03-03 NOTE — Telephone Encounter (Signed)
Ok to refill??  Last office visit 11/08/2018.  Last refill 01/27/2019.

## 2019-03-03 NOTE — Telephone Encounter (Signed)
Ok to refill??  Last office visit 11/08/2018.  Last refill 03/03/2019

## 2019-04-08 ENCOUNTER — Other Ambulatory Visit: Payer: Self-pay | Admitting: Family Medicine

## 2019-04-08 MED ORDER — AMPHETAMINE-DEXTROAMPHET ER 30 MG PO CP24: 30.0000 mg | ORAL_CAPSULE | ORAL | 0 refills | Status: DC

## 2019-04-08 NOTE — Telephone Encounter (Signed)
Ok to refill??  Last office visit 11/08/2018.  Last refill 03/03/2019.

## 2019-05-09 ENCOUNTER — Other Ambulatory Visit: Payer: Self-pay | Admitting: Family Medicine

## 2019-05-09 MED ORDER — AMPHETAMINE-DEXTROAMPHET ER 30 MG PO CP24: 30.0000 mg | ORAL_CAPSULE | ORAL | 0 refills | Status: DC

## 2019-05-09 NOTE — Telephone Encounter (Signed)
Ok to refill??   Last office visit 09/ 18/2020.  Last refill 04/08/2019.

## 2019-06-09 ENCOUNTER — Other Ambulatory Visit: Payer: Self-pay | Admitting: Family Medicine

## 2019-06-09 MED ORDER — AMPHETAMINE-DEXTROAMPHET ER 30 MG PO CP24: 30.0000 mg | ORAL_CAPSULE | ORAL | 0 refills | Status: DC

## 2019-06-09 NOTE — Telephone Encounter (Signed)
Requested Prescriptions   Pending Prescriptions Disp Refills  . amphetamine-dextroamphetamine (ADDERALL XR) 30 MG 24 hr capsule 30 capsule 0    Sig: Take 1 capsule (30 mg total) by mouth every morning.    Last OV 11/08/2018   Last written 05/09/2019

## 2019-07-14 ENCOUNTER — Other Ambulatory Visit: Payer: Self-pay | Admitting: Family Medicine

## 2019-07-14 MED ORDER — AMPHETAMINE-DEXTROAMPHET ER 30 MG PO CP24: 30.0000 mg | ORAL_CAPSULE | ORAL | 0 refills | Status: DC

## 2019-07-14 NOTE — Telephone Encounter (Signed)
Ok to refill??  Last office visit 11/08/2018.  Last refill 06/09/2019.

## 2019-08-14 ENCOUNTER — Other Ambulatory Visit: Payer: Self-pay | Admitting: Family Medicine

## 2019-08-14 NOTE — Telephone Encounter (Signed)
Ok to refill??  Last office visit 11/08/2018.  Last refill 07/14/2019.  Of note, letter sent to patient to schedule OV.

## 2019-08-15 MED ORDER — AMPHETAMINE-DEXTROAMPHET ER 30 MG PO CP24: 30.0000 mg | ORAL_CAPSULE | ORAL | 0 refills | Status: DC

## 2019-09-05 ENCOUNTER — Encounter: Payer: Self-pay | Admitting: Family Medicine

## 2019-09-05 ENCOUNTER — Other Ambulatory Visit: Payer: Self-pay

## 2019-09-05 ENCOUNTER — Ambulatory Visit (INDEPENDENT_AMBULATORY_CARE_PROVIDER_SITE_OTHER): Payer: BC Managed Care – PPO | Admitting: Family Medicine

## 2019-09-05 VITALS — BP 116/74 | HR 75 | Temp 97.8°F | Resp 16 | Ht 65.5 in | Wt 141.4 lb

## 2019-09-05 DIAGNOSIS — F17200 Nicotine dependence, unspecified, uncomplicated: Secondary | ICD-10-CM | POA: Diagnosis not present

## 2019-09-05 DIAGNOSIS — F988 Other specified behavioral and emotional disorders with onset usually occurring in childhood and adolescence: Secondary | ICD-10-CM | POA: Diagnosis not present

## 2019-09-05 DIAGNOSIS — F330 Major depressive disorder, recurrent, mild: Secondary | ICD-10-CM | POA: Diagnosis not present

## 2019-09-05 MED ORDER — AMPHETAMINE-DEXTROAMPHET ER 30 MG PO CP24: 30.0000 mg | ORAL_CAPSULE | ORAL | 0 refills | Status: DC

## 2019-09-05 MED ORDER — BUPROPION HCL ER (XL) 300 MG PO TB24: 300.0000 mg | ORAL_TABLET | Freq: Every day | ORAL | 1 refills | Status: DC

## 2019-09-05 NOTE — Assessment & Plan Note (Signed)
counseled on cessation 

## 2019-09-05 NOTE — Assessment & Plan Note (Signed)
SHe is doing well on Adderall which helps her concentrate going work.  No change in dose.  Return for physical and fasting labs

## 2019-09-05 NOTE — Assessment & Plan Note (Signed)
Continue Wellbutrin.  This should also help with her tobacco cravings.

## 2019-09-05 NOTE — Progress Notes (Signed)
   Subjective:    Patient ID: Deanna Calderon, female    DOB: 08/12/1975, 44 y.o.   MRN: [id number]  Patient presents for Medication Management (f/u meds ) Here to follow-up chronic medical problems.  She has no new concerns today.  She is overdue for her wellness visit and physical she is going to get the schedule.  She is due for mammogram.  She is also due for Tdap but declines this today we will get it at her physical.  She was on and off of Wellbutrin but feels like she does better on it.  Says she has been taking consistently recently.  She would like this refilled.  No side effects with the medication.  She is also using Adderall to help with concentration especially during her workdays.  She does continue to smoke but typically does socially on the weekends.       COVID-19 vaccine- she has not had   -   Due for labs, last labs in Dec 2019   Review Of Systems:  GEN- denies fatigue, fever, weight loss,weakness, recent illness HEENT- denies eye drainage, change in vision, nasal discharge, CVS- denies chest pain, palpitations RESP- denies SOB, cough, wheeze ABD- denies N/V, change in stools, abd pain GU- denies dysuria, hematuria, dribbling, incontinence MSK- denies joint pain, muscle aches, injury Neuro- denies headache, dizziness, syncope, seizure activity       Objective:    BP 116/74   Pulse 75   Temp 97.8 F (36.6 C)   Resp 16   Ht 5' 5.5" (1.664 m)   Wt 141 lb 6.4 oz (64.1 kg)   SpO2 100%   BMI 23.17 kg/m  GEN- NAD, alert and oriented x3 HEENT- PERRL, EOMI, non injected sclera, pink conjunctiva, MMM, oropharynx clear Neck- Supple, no thyromegaly CVS- RRR, no murmur RESP-CTAB Psych normal affect and mood. EXT- No edema Pulses- Radial2+        Assessment & Plan:      Problem List Items Addressed This Visit      Unprioritized   ADD (attention deficit disorder) - Primary    SHe is doing well on Adderall which helps her concentrate going work.  No  change in dose.  Return for physical and fasting labs      MDD (major depressive disorder)    Continue Wellbutrin.  This should also help with her tobacco cravings.      Relevant Medications   buPROPion (WELLBUTRIN XL) 300 MG 24 hr tablet   Tobacco use disorder    counseled on cessation          Note: This dictation was prepared with Dragon dictation along with smaller phrase technology. Any transcriptional errors that result from this process are unintentional.

## 2019-09-05 NOTE — Patient Instructions (Addendum)
Schedule GYN visit  F/U 6 months Physical

## 2019-09-23 ENCOUNTER — Encounter: Payer: Self-pay | Admitting: Family Medicine

## 2019-10-21 ENCOUNTER — Other Ambulatory Visit: Payer: Self-pay | Admitting: Family Medicine

## 2019-10-22 MED ORDER — AMPHETAMINE-DEXTROAMPHET ER 30 MG PO CP24: 30.0000 mg | ORAL_CAPSULE | ORAL | 0 refills | Status: DC

## 2019-10-22 NOTE — Telephone Encounter (Signed)
Ok to refill??  Last office visit/ refill 09/05/2019.

## 2019-11-17 ENCOUNTER — Other Ambulatory Visit: Payer: Self-pay | Admitting: Family Medicine

## 2019-11-19 MED ORDER — AMPHETAMINE-DEXTROAMPHET ER 30 MG PO CP24: 30.0000 mg | ORAL_CAPSULE | ORAL | 0 refills | Status: DC

## 2019-11-19 NOTE — Telephone Encounter (Signed)
Last refill 10-22-19

## 2019-12-18 ENCOUNTER — Other Ambulatory Visit: Payer: Self-pay | Admitting: Family Medicine

## 2019-12-19 MED ORDER — AMPHETAMINE-DEXTROAMPHET ER 30 MG PO CP24
30.0000 mg | ORAL_CAPSULE | ORAL | 0 refills | Status: DC
Start: 2019-12-19 — End: 2020-01-22

## 2020-01-22 ENCOUNTER — Other Ambulatory Visit: Payer: Self-pay | Admitting: Family Medicine

## 2020-01-22 ENCOUNTER — Telehealth: Payer: BC Managed Care – PPO | Admitting: Physician Assistant

## 2020-01-22 DIAGNOSIS — R0981 Nasal congestion: Secondary | ICD-10-CM | POA: Diagnosis not present

## 2020-01-22 MED ORDER — FLUCONAZOLE 150 MG PO TABS: 150.0000 mg | ORAL_TABLET | Freq: Once | ORAL | 0 refills | Status: AC

## 2020-01-22 MED ORDER — AMPHETAMINE-DEXTROAMPHET ER 30 MG PO CP24
30.0000 mg | ORAL_CAPSULE | ORAL | 0 refills | Status: DC
Start: 2020-01-22 — End: 2020-02-23

## 2020-01-22 MED ORDER — AMOXICILLIN-POT CLAVULANATE 875-125 MG PO TABS: 1.0000 | ORAL_TABLET | Freq: Two times a day (BID) | ORAL | 0 refills | Status: DC

## 2020-01-22 NOTE — Progress Notes (Signed)
We are sorry that you are not feeling well.  Here is how we plan to help!  Based on what you have shared with me it looks like you have sinusitis.  Sinusitis is inflammation and infection in the sinus cavities of the head.  Based on your presentation I believe you most likely have Acute Bacterial Sinusitis.  This is an infection caused by bacteria and is treated with antibiotics. I have prescribed Augmentin 875mg /125mg  one tablet twice daily with food, for 7 days. You may use an oral decongestant such as Mucinex D or if you have glaucoma or high blood pressure use plain Mucinex. Saline nasal spray help and can safely be used as often as needed for congestion.  If you develop worsening sinus pain, fever or notice severe headache and vision changes, or if symptoms are not better after completion of antibiotic, please schedule an appointment with a health care provider.    Sinus infections are not as easily transmitted as other respiratory infection, however we still recommend that you avoid close contact with loved ones, especially the very young and elderly.  Remember to wash your hands thoroughly throughout the day as this is the number one way to prevent the spread of infection!  Home Care:  Only take medications as instructed by your medical team.  Complete the entire course of an antibiotic.  Do not take these medications with alcohol.  A steam or ultrasonic humidifier can help congestion.  You can place a towel over your head and breathe in the steam from hot water coming from a faucet.  Avoid close contacts especially the very young and the elderly.  Cover your mouth when you cough or sneeze.  Always remember to wash your hands.  Get Help Right Away If:  You develop worsening fever or sinus pain.  You develop a severe head ache or visual changes.  Your symptoms persist after you have completed your treatment plan.  Make sure you  Understand these instructions.  Will watch your  condition.  Will get help right away if you are not doing well or get worse.  Your e-visit answers were reviewed by a board certified advanced clinical practitioner to complete your personal care plan.  Depending on the condition, your plan could have included both over the counter or prescription medications.  If there is a problem please reply  once you have received a response from your provider.  Your safety is important to [place].  If you have drug allergies check your prescription carefully.    You can use MyChart to ask questions about todays visit, request a non-urgent call back, or ask for a work or school excuse for 24 hours related to this e-Visit. If it has been greater than 24 hours you will need to follow up with your provider, or enter a new e-Visit to address those concerns.  You will get an e-mail in the next two days asking about your experience.  I hope that your e-visit has been valuable and will speed your recovery. Thank you for using e-visits.  Greater than 5 minutes, yet less than 10 minutes of time have been spent researching, coordinating, and implementing care for this patient today.  Inda Coke PA-C

## 2020-01-22 NOTE — Telephone Encounter (Signed)
Ok to refill??  Last office visit  09/05/2019.  Last refill 12/19/2019.

## 2020-02-23 ENCOUNTER — Other Ambulatory Visit: Payer: Self-pay | Admitting: Family Medicine

## 2020-02-25 MED ORDER — AMPHETAMINE-DEXTROAMPHET ER 30 MG PO CP24
30.0000 mg | ORAL_CAPSULE | ORAL | 0 refills | Status: DC
Start: 2020-02-25 — End: 2020-03-31

## 2020-02-25 NOTE — Telephone Encounter (Signed)
Ok to refill??  Last office visit 09/05/2019.  Last refill 01/22/2020.

## 2020-03-31 ENCOUNTER — Other Ambulatory Visit: Payer: Self-pay | Admitting: Family Medicine

## 2020-03-31 MED ORDER — AMPHETAMINE-DEXTROAMPHET ER 30 MG PO CP24
30.0000 mg | ORAL_CAPSULE | ORAL | 0 refills | Status: DC
Start: 2020-03-31 — End: 2020-04-27

## 2020-04-14 ENCOUNTER — Other Ambulatory Visit: Payer: Self-pay | Admitting: Family Medicine

## 2020-04-14 DIAGNOSIS — Z1231 Encounter for screening mammogram for malignant neoplasm of breast: Secondary | ICD-10-CM

## 2020-04-27 ENCOUNTER — Encounter: Payer: Self-pay | Admitting: Family Medicine

## 2020-04-27 ENCOUNTER — Ambulatory Visit (INDEPENDENT_AMBULATORY_CARE_PROVIDER_SITE_OTHER): Payer: BC Managed Care – PPO | Admitting: Family Medicine

## 2020-04-27 ENCOUNTER — Other Ambulatory Visit: Payer: Self-pay

## 2020-04-27 VITALS — BP 122/82 | HR 76 | Temp 98.1°F | Resp 16 | Ht 66.0 in | Wt 143.2 lb

## 2020-04-27 DIAGNOSIS — Z136 Encounter for screening for cardiovascular disorders: Secondary | ICD-10-CM | POA: Diagnosis not present

## 2020-04-27 DIAGNOSIS — Z0000 Encounter for general adult medical examination without abnormal findings: Secondary | ICD-10-CM | POA: Diagnosis not present

## 2020-04-27 DIAGNOSIS — Z0001 Encounter for general adult medical examination with abnormal findings: Secondary | ICD-10-CM

## 2020-04-27 DIAGNOSIS — F330 Major depressive disorder, recurrent, mild: Secondary | ICD-10-CM

## 2020-04-27 DIAGNOSIS — F17200 Nicotine dependence, unspecified, uncomplicated: Secondary | ICD-10-CM

## 2020-04-27 DIAGNOSIS — Z1159 Encounter for screening for other viral diseases: Secondary | ICD-10-CM

## 2020-04-27 DIAGNOSIS — F988 Other specified behavioral and emotional disorders with onset usually occurring in childhood and adolescence: Secondary | ICD-10-CM

## 2020-04-27 DIAGNOSIS — Z13220 Encounter for screening for lipoid disorders: Secondary | ICD-10-CM | POA: Diagnosis not present

## 2020-04-27 MED ORDER — BUPROPION HCL ER (XL) 300 MG PO TB24
300.0000 mg | ORAL_TABLET | Freq: Every day | ORAL | 1 refills | Status: DC
Start: 2020-04-27 — End: 2021-06-28

## 2020-04-27 MED ORDER — VALACYCLOVIR HCL 1 G PO TABS
1000.0000 mg | ORAL_TABLET | Freq: Two times a day (BID) | ORAL | 0 refills | Status: AC
Start: 2020-04-27 — End: ?

## 2020-04-27 MED ORDER — AMPHETAMINE-DEXTROAMPHET ER 30 MG PO CP24
30.0000 mg | ORAL_CAPSULE | ORAL | 0 refills | Status: DC
Start: 2020-04-27 — End: 2020-06-02

## 2020-04-27 NOTE — Patient Instructions (Addendum)
F/U 6 months Dr. Dennard Schaumann   Schedule your PAP smear

## 2020-04-27 NOTE — Progress Notes (Signed)
   Subjective:    Patient ID: Deanna Calderon, female    DOB: 1975/03/03, 45 y.o.   MRN: [id number]  Patient presents for Annual Exam Pt here for CPE Medications and history reviewed  ADHD she is maintained on Adderall 30mg  XR and wellbutrin 300mg  does well with this combination  Due for PAP Smear - last in 2018 she will call her GYN   Immunizations- Declined COVID, TDAP/FLU UTD   Discussed colon cancer screening  Due for fasting labs and Hep C screening   Mammogram scheduled   No new concerns   Works as Marine scientist prn   Review Of Systems:  GEN- denies fatigue, fever, weight loss,weakness, recent illness HEENT- denies eye drainage, change in vision, nasal discharge, CVS- denies chest pain, palpitations RESP- denies SOB, cough, wheeze ABD- denies N/V, change in stools, abd pain GU- denies dysuria, hematuria, dribbling, incontinence MSK- denies joint pain, muscle aches, injury Neuro- denies headache, dizziness, syncope, seizure activity       Objective:    BP 122/82   Pulse 76   Temp 98.1 F (36.7 C) (Temporal)   Resp 16   Ht 5\' 6"  (1.676 m)   Wt 143 lb 3.2 oz (65 kg)   SpO2 99%   BMI 23.11 kg/m  GEN- NAD, alert and oriented x3 HEENT- PERRL, EOMI, non injected sclera, pink conjunctiva, MMM, oropharynx clear Neck- Supple, no thyromegaly CVS- RRR, no murmur RESP-CTAB ABD-NABS,soft,NT,ND Psych normal affect and mood   EXT- No edema Pulses- Radial, DP- 2+        Assessment & Plan:      Problem List Items Addressed This Visit      Unprioritized   ADD (attention deficit disorder)    Controlled on adderall and wellbutrin Functions well at work  wellbutrin also helps with tobacco cravings       MDD (major depressive disorder)   Relevant Medications   buPROPion (WELLBUTRIN XL) 300 MG 24 hr tablet   Tobacco use disorder    She has cut down significantly        Other Visit Diagnoses    Routine general medical examination at a health care facility    -   Primary   CPE done, mammo scheduled, defer colon cancer screening to age 77, no family history, pt to schedule PAP with GYN   Relevant Orders   CBC with Differential/Platelet   Comprehensive metabolic panel   Lipid panel   Need for hepatitis C screening test       Relevant Orders   Hepatitis C antibody      Note: This dictation was prepared with Dragon dictation along with smaller phrase technology. Any transcriptional errors that result from this process are unintentional.

## 2020-04-27 NOTE — Assessment & Plan Note (Signed)
Controlled on adderall and wellbutrin Functions well at work  wellbutrin also helps with tobacco cravings

## 2020-04-27 NOTE — Assessment & Plan Note (Signed)
She has cut down significantly

## 2020-04-28 LAB — CBC WITH DIFFERENTIAL/PLATELET
Absolute Monocytes: 759 cells/uL (ref 200–950)
Basophils Absolute: 28 cells/uL (ref 0–200)
Basophils Relative: 0.5 %
Eosinophils Absolute: 39 cells/uL (ref 15–500)
Eosinophils Relative: 0.7 %
HCT: 38.5 % (ref 35.0–45.0)
Hemoglobin: 13.1 g/dL (ref 11.7–15.5)
Lymphs Abs: 1463 cells/uL (ref 850–3900)
MCH: 30.8 pg (ref 27.0–33.0)
MCHC: 34 g/dL (ref 32.0–36.0)
MCV: 90.4 fL (ref 80.0–100.0)
MPV: 10.4 fL (ref 7.5–12.5)
Monocytes Relative: 13.8 %
Neutro Abs: 3212 cells/uL (ref 1500–7800)
Neutrophils Relative %: 58.4 %
Platelets: 250 10*3/uL (ref 140–400)
RBC: 4.26 10*6/uL (ref 3.80–5.10)
RDW: 11.8 % (ref 11.0–15.0)
Total Lymphocyte: 26.6 %
WBC: 5.5 10*3/uL (ref 3.8–10.8)

## 2020-04-28 LAB — LIPID PANEL
Cholesterol: 181 mg/dL (ref <200)
HDL: 62 mg/dL (ref >=50)
LDL Cholesterol (Calc): 103 mg/dL (calc) — ABNORMAL HIGH
Non-HDL Cholesterol (Calc): 119 mg/dL (calc) (ref <130)
Total CHOL/HDL Ratio: 2.9 (calc) (ref <5.0)
Triglycerides: 72 mg/dL (ref <150)

## 2020-04-28 LAB — COMPREHENSIVE METABOLIC PANEL
AG Ratio: 2.1 (calc) (ref 1.0–2.5)
ALT: 11 U/L (ref 6–29)
AST: 14 U/L (ref 10–35)
Albumin: 4.1 g/dL (ref 3.6–5.1)
Alkaline phosphatase (APISO): 48 U/L (ref 31–125)
BUN: 10 mg/dL (ref 7–25)
CO2: 24 mmol/L (ref 20–32)
Calcium: 8.9 mg/dL (ref 8.6–10.2)
Chloride: 105 mmol/L (ref 98–110)
Creat: 0.82 mg/dL (ref 0.50–1.10)
Globulin: 2 g/dL (calc) (ref 1.9–3.7)
Glucose, Bld: 85 mg/dL (ref 65–99)
Potassium: 4 mmol/L (ref 3.5–5.3)
Sodium: 138 mmol/L (ref 135–146)
Total Bilirubin: 0.4 mg/dL (ref 0.2–1.2)
Total Protein: 6.1 g/dL (ref 6.1–8.1)

## 2020-04-28 LAB — HEPATITIS C ANTIBODY
Hepatitis C Ab: NONREACTIVE
SIGNAL TO CUT-OFF: 0 (ref <1.00)

## 2020-05-24 ENCOUNTER — Inpatient Hospital Stay: Admission: RE | Admit: 2020-05-24 | Payer: BC Managed Care – PPO | Source: Ambulatory Visit

## 2020-05-24 DIAGNOSIS — Z1231 Encounter for screening mammogram for malignant neoplasm of breast: Secondary | ICD-10-CM

## 2020-06-02 ENCOUNTER — Other Ambulatory Visit: Payer: Self-pay | Admitting: Family Medicine

## 2020-06-03 MED ORDER — AMPHETAMINE-DEXTROAMPHET ER 30 MG PO CP24: 30.0000 mg | ORAL_CAPSULE | ORAL | 0 refills | Status: DC

## 2020-07-05 ENCOUNTER — Other Ambulatory Visit: Payer: Self-pay | Admitting: Family Medicine

## 2020-07-05 MED ORDER — AMPHETAMINE-DEXTROAMPHET ER 30 MG PO CP24: 30.0000 mg | ORAL_CAPSULE | ORAL | 0 refills | Status: DC

## 2020-08-05 ENCOUNTER — Other Ambulatory Visit: Payer: Self-pay | Admitting: Family Medicine

## 2020-08-05 MED ORDER — AMPHETAMINE-DEXTROAMPHET ER 30 MG PO CP24: 30.0000 mg | ORAL_CAPSULE | ORAL | 0 refills | Status: DC

## 2020-08-05 NOTE — Telephone Encounter (Signed)
Ok to refill??  Last office visit 04/27/2020.  Last refill /16/2022.

## 2020-09-03 ENCOUNTER — Other Ambulatory Visit: Payer: Self-pay | Admitting: Family Medicine

## 2020-09-06 ENCOUNTER — Other Ambulatory Visit: Payer: Self-pay | Admitting: Family Medicine

## 2020-09-06 DIAGNOSIS — Z1231 Encounter for screening mammogram for malignant neoplasm of breast: Secondary | ICD-10-CM

## 2020-09-07 ENCOUNTER — Other Ambulatory Visit: Payer: Self-pay | Admitting: Family Medicine

## 2020-09-07 MED ORDER — AMPHETAMINE-DEXTROAMPHET ER 30 MG PO CP24: 30.0000 mg | ORAL_CAPSULE | ORAL | 0 refills | Status: DC

## 2020-09-24 ENCOUNTER — Telehealth: Payer: BC Managed Care – PPO | Admitting: Nurse Practitioner

## 2020-09-24 DIAGNOSIS — B3731 Acute candidiasis of vulva and vagina: Secondary | ICD-10-CM

## 2020-09-24 DIAGNOSIS — B373 Candidiasis of vulva and vagina: Secondary | ICD-10-CM

## 2020-09-25 MED ORDER — FLUCONAZOLE 150 MG PO TABS: 150.0000 mg | ORAL_TABLET | Freq: Once | ORAL | 0 refills | Status: AC

## 2020-09-25 NOTE — Progress Notes (Signed)
E-Visit for Vaginal Symptoms  We are sorry that you are not feeling well. Here is how we plan to help! Based on what you shared with me it looks like you: May have a yeast vaginosis  Vaginosis is an inflammation of the vagina that can result in discharge, itching and pain. The cause is usually a change in the normal balance of vaginal bacteria or an infection. Vaginosis can also result from reduced estrogen levels after menopause.  The most common causes of vaginosis are:   Bacterial vaginosis which results from an overgrowth of one on several organisms that are normally present in your vagina.   Yeast infections which are caused by a naturally occurring fungus called candida.   Vaginal atrophy (atrophic vaginosis) which results from the thinning of the vagina from reduced estrogen levels after menopause.   Trichomoniasis which is caused by a parasite and is commonly transmitted by sexual intercourse.  Factors that increase your risk of developing vaginosis include: Medications, such as antibiotics and steroids Uncontrolled diabetes Use of hygiene products such as bubble bath, vaginal spray or vaginal deodorant Douching Wearing damp or tight-fitting clothing Using an intrauterine device (IUD) for birth control Hormonal changes, such as those associated with pregnancy, birth control pills or menopause Sexual activity Having a sexually transmitted infection  Your treatment plan is A single Diflucan (fluconazole) 150mg tablet once.  I have electronically sent this prescription into the pharmacy that you have chosen.  Be sure to take all of the medication as directed. Stop taking any medication if you develop a rash, tongue swelling or shortness of breath. Mothers who are breast feeding should consider pumping and discarding their breast milk while on these antibiotics. However, there is no consensus that infant exposure at these doses would be harmful.  Remember that medication creams can  weaken latex condoms. .   HOME CARE:  Good hygiene may prevent some types of vaginosis from recurring and may relieve some symptoms:  Avoid baths, hot tubs and whirlpool spas. Rinse soap from your outer genital area after a shower, and dry the area well to prevent irritation. Don't use scented or harsh soaps, such as those with deodorant or antibacterial action. Avoid irritants. These include scented tampons and pads. Wipe from front to back after using the toilet. Doing so avoids spreading fecal bacteria to your vagina.  Other things that may help prevent vaginosis include:  Don't douche. Your vagina doesn't require cleansing other than normal bathing. Repetitive douching disrupts the normal organisms that reside in the vagina and can actually increase your risk of vaginal infection. Douching won't clear up a vaginal infection. Use a latex condom. Both female and female latex condoms may help you avoid infections spread by sexual contact. Wear cotton underwear. Also wear pantyhose with a cotton crotch. If you feel comfortable without it, skip wearing underwear to bed. Yeast thrives in moist environments Your symptoms should improve in the next day or two.  GET HELP RIGHT AWAY IF:  You have pain in your lower abdomen ( pelvic area or over your ovaries) You develop nausea or vomiting You develop a fever Your discharge changes or worsens You have persistent pain with intercourse You develop shortness of breath, a rapid pulse, or you faint.  These symptoms could be signs of problems or infections that need to be evaluated by a medical provider now.  MAKE SURE YOU   Understand these instructions. Will watch your condition. Will get help right away if you are not   doing well or get worse.  Thank you for choosing an e-visit.  Your e-visit answers were reviewed by a board certified advanced clinical practitioner to complete your personal care plan. Depending upon the condition, your plan  could have included both over the counter or prescription medications.  Please review your pharmacy choice. Make sure the pharmacy is open so you can pick up prescription now. If there is a problem, you may contact your provider through [employer] and have the prescription routed to another pharmacy.  Your safety is important to [place]. If you have drug allergies check your prescription carefully.   For the next 24 hours you can use MyChart to ask questions about today's visit, request a non-urgent call back, or ask for a work or school excuse. You will get an email in the next two days asking about your experience. I hope that your e-visit has been valuable and will speed your recovery.   I spent approximately 7 minutes reviewing the patient's history, current symptoms and coordinating their plan of care today.    Meds ordered this encounter  Medications   fluconazole (DIFLUCAN) 150 MG tablet    Sig: Take 1 tablet (150 mg total) by mouth once for 1 dose.    Dispense:  1 tablet    Refill:  0

## 2020-10-08 ENCOUNTER — Encounter: Payer: Self-pay | Admitting: Family Medicine

## 2020-10-08 ENCOUNTER — Other Ambulatory Visit (HOSPITAL_COMMUNITY): Payer: Self-pay

## 2020-10-08 MED ORDER — AMPHETAMINE-DEXTROAMPHET ER 30 MG PO CP24
30.0000 mg | ORAL_CAPSULE | ORAL | 0 refills | Status: DC
  Filled 2020-10-08: qty 30, 30d supply, fill #0

## 2020-10-08 NOTE — Telephone Encounter (Signed)
Ok to refill??  Last office visit 04/27/2020.  Last refill 09/07/2020.

## 2020-10-11 ENCOUNTER — Other Ambulatory Visit: Payer: Self-pay | Admitting: Family Medicine

## 2020-10-11 ENCOUNTER — Other Ambulatory Visit (HOSPITAL_COMMUNITY): Payer: Self-pay

## 2020-10-11 MED ORDER — AMPHETAMINE-DEXTROAMPHET ER 30 MG PO CP24: 30.0000 mg | ORAL_CAPSULE | ORAL | 0 refills | Status: DC

## 2020-10-28 ENCOUNTER — Encounter: Payer: Self-pay | Admitting: Family Medicine

## 2020-10-28 ENCOUNTER — Ambulatory Visit: Payer: BC Managed Care – PPO | Admitting: Family Medicine

## 2020-10-28 ENCOUNTER — Other Ambulatory Visit: Payer: Self-pay

## 2020-10-28 VITALS — BP 108/64 | HR 78 | Temp 98.4°F | Resp 14 | Ht 66.0 in | Wt 145.0 lb

## 2020-10-28 DIAGNOSIS — F3342 Major depressive disorder, recurrent, in full remission: Secondary | ICD-10-CM | POA: Diagnosis not present

## 2020-10-28 DIAGNOSIS — F988 Other specified behavioral and emotional disorders with onset usually occurring in childhood and adolescence: Secondary | ICD-10-CM

## 2020-10-28 NOTE — Progress Notes (Signed)
Subjective:    Patient ID: Deanna Calderon, female    DOB: [birth year], 45 y.o.   MRN: [id number]  HPI Patient is a very pleasant 45 year old Caucasian female here today to establish care with me.  She has ADD.  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  If she does not take the medication she can certainly tell a significant difference and she has a difficult time focusing and maintaining her attention to complete her work assignments.  She denies any tachycardia or palpitations or insomnia or nightmares on the medication.  She is also on Wellbutrin for anxiety and depression and this seems to be working well with no side effects.  She has a mammogram scheduled for later this week.  Her Pap smears performed by her gynecologist.  She is due for a flu shot but she politely defers that at the present time.  We also discussed colonoscopies but at the present time she would like to defer that as well. 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 30 capsule 0   buPROPion (WELLBUTRIN XL) 300 MG 24 hr tablet Take 1 tablet (300 mg total) by mouth daily. 90 tablet 1   valACYclovir (VALTREX) 1000 MG tablet Take 1 tablet (1,000 mg total) by mouth 2 (two) times daily. 14 tablet 0   No current facility-administered medications on file prior to visit.   Marland Kitchenall Social History   Socioeconomic History   Marital status: Married    Spouse name: Not on file   Number of children: Not on file   Years of education: Not on  file   Highest education level: Not on file  Occupational History   Not on file  Tobacco Use   Smoking status: Some Days    Types: Cigarettes   Smokeless tobacco: Never  Substance and Sexual Activity   Alcohol use: No    Alcohol/week: 0.0 standard drinks   Drug use: No   Sexual activity: Yes    Birth control/protection: None    Comment: PATIENT'S HUSBAND WITH VASECTOMY.. 1st intercourse- 14, partners- 3  Other Topics Concern   Not on file  Social History Narrative   Not on file   Social Determinants of Health   Financial Resource Strain: Not on file  Food Insecurity: Not on file  Transportation Needs: Not on file  Physical Activity: Not on file  Stress: Not on file  Social Connections: Not on file  Intimate Partner Violence: Not on file     Review of Systems  All other systems reviewed and are negative.     Objective:   Physical Exam Vitals reviewed.  Constitutional:      Appearance: Normal appearance. She is normal weight.  Cardiovascular:     Rate and Rhythm: Normal rate and regular rhythm.     Pulses: Normal pulses.     Heart sounds: Normal heart sounds. No murmur heard.   No friction rub. No gallop.  Pulmonary:     Effort: Pulmonary effort is normal. No respiratory distress.     Breath sounds: Normal breath sounds. No stridor. No wheezing or rales.  Abdominal:     General: Abdomen is flat. Bowel sounds are normal.     Palpations: Abdomen is soft.  Musculoskeletal:     Right lower leg: No edema.     Left lower leg: No edema.  Neurological:     Mental Status: She is alert.         Assessment & Plan:  Attention deficit disorder (ADD) without hyperactivity  Recurrent major depressive disorder, in full remission [facility]) Patient feels that the Wellbutrin is doing a good job controlling her depression.  She also feels that the Adderall is working well to manage her ADD.  Therefore we will not make any changes in her current dose of her medications.  Her  mammogram has already been scheduled.  Her gynecologist performing her Pap smears.  I recommended a colonoscopy as well as a flu shot but she defers that at the present time.  I reviewed her lab work from March which was outstanding.  I also congratulated the patient on quitting smoking

## 2020-10-29 DIAGNOSIS — Z1231 Encounter for screening mammogram for malignant neoplasm of breast: Secondary | ICD-10-CM

## 2020-11-12 ENCOUNTER — Other Ambulatory Visit: Payer: Self-pay | Admitting: Family Medicine

## 2020-11-12 MED ORDER — AMPHETAMINE-DEXTROAMPHET ER 30 MG PO CP24: 30.0000 mg | ORAL_CAPSULE | ORAL | 0 refills | Status: DC

## 2020-11-12 NOTE — Telephone Encounter (Signed)
Ok to refill??  Last office visit 10/28/2020.  Last refill 10/11/2020.

## 2020-12-16 ENCOUNTER — Other Ambulatory Visit: Payer: Self-pay | Admitting: Family Medicine

## 2020-12-17 MED ORDER — AMPHETAMINE-DEXTROAMPHET ER 30 MG PO CP24: 30.0000 mg | ORAL_CAPSULE | ORAL | 0 refills | Status: DC

## 2020-12-17 NOTE — Telephone Encounter (Signed)
Ok to refill??  Last office visit 10/28/2020.  Last refill 11/12/2020.

## 2021-01-16 ENCOUNTER — Other Ambulatory Visit: Payer: Self-pay | Admitting: Family Medicine

## 2021-01-17 MED ORDER — AMPHETAMINE-DEXTROAMPHET ER 30 MG PO CP24: 30.0000 mg | ORAL_CAPSULE | ORAL | 0 refills | Status: DC

## 2021-01-20 ENCOUNTER — Ambulatory Visit: Payer: BC Managed Care – PPO | Admitting: Family Medicine

## 2021-02-17 ENCOUNTER — Other Ambulatory Visit: Payer: Self-pay | Admitting: Family Medicine

## 2021-02-18 MED ORDER — AMPHETAMINE-DEXTROAMPHET ER 30 MG PO CP24: 30.0000 mg | ORAL_CAPSULE | ORAL | 0 refills | Status: DC

## 2021-03-22 ENCOUNTER — Other Ambulatory Visit: Payer: Self-pay | Admitting: Family Medicine

## 2021-03-22 MED ORDER — AMPHETAMINE-DEXTROAMPHET ER 30 MG PO CP24: 30.0000 mg | ORAL_CAPSULE | ORAL | 0 refills | Status: DC

## 2021-03-22 NOTE — Telephone Encounter (Signed)
Adderall refill request.  Last seen 10/28/2020, last filled 02/18/2021.

## 2021-04-24 ENCOUNTER — Other Ambulatory Visit: Payer: Self-pay | Admitting: Family Medicine

## 2021-04-28 ENCOUNTER — Ambulatory Visit: Payer: BC Managed Care – PPO | Admitting: Family Medicine

## 2021-04-28 MED ORDER — AMPHETAMINE-DEXTROAMPHET ER 30 MG PO CP24: 30.0000 mg | ORAL_CAPSULE | ORAL | 0 refills | Status: DC

## 2021-04-28 NOTE — Telephone Encounter (Signed)
LOV 10/28/20 ?Last refill 03/22/21, #30, 0 refills ? ?Please review, thanks! ?

## 2021-04-29 ENCOUNTER — Encounter: Payer: Self-pay | Admitting: Family Medicine

## 2021-04-29 ENCOUNTER — Other Ambulatory Visit: Payer: Self-pay

## 2021-04-29 ENCOUNTER — Ambulatory Visit: Payer: BC Managed Care – PPO | Admitting: Family Medicine

## 2021-04-29 VITALS — BP 118/78 | HR 72 | Temp 97.3°F | Resp 18 | Ht 66.0 in | Wt 150.0 lb

## 2021-04-29 DIAGNOSIS — F988 Other specified behavioral and emotional disorders with onset usually occurring in childhood and adolescence: Secondary | ICD-10-CM

## 2021-04-29 NOTE — Progress Notes (Signed)
? ?Subjective:  ? ? Patient ID: Deanna Calderon, female    DOB: March 11, 1975, 46 y.o.   MRN: [id number] ? ?HPI ?10/28/20 ?Patient is a very pleasant 46 year old Caucasian female here today to establish care with me.  She has ADD.  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  If she does not take the medication she can certainly tell a significant difference and she has a difficult time focusing and maintaining her attention to complete her work assignments.  She denies any tachycardia or palpitations or insomnia or nightmares on the medication.  She is also on Wellbutrin for anxiety and depression and this seems to be working well with no side effects.  She has a mammogram scheduled for later this week.  Her Pap smears performed by her gynecologist.  She is due for a flu shot but she politely defers that at the present time.  We also discussed colonoscopies but at the present time she would like to defer that as well.  At that time, my plan was: ? ?Patient feels that the Wellbutrin is doing a good job controlling her depression.  She also feels that the Adderall is working well to manage her ADD.  Therefore we will not make any changes in her current dose of her medications.  Her mammogram has already been scheduled.  Her gynecologist performing her Pap smears.  I recommended a colonoscopy as well as a flu shot but she defers that at the present time.  I reviewed her lab work from March which was outstanding.  I also congratulated the patient on quitting smoking ? ?04/29/21 ?Patient last had lab work in March 2022.  At that time her cholesterol was outstanding and her CBC and CMP were normal.  However she is overdue for mammogram.  She is also overdue for colonoscopy.  I strongly encouraged the patient to follow through with this.  She states that she prefers to schedule her mammogram.  She defers the colonoscopy at the present time.  She has not had a  skin check in over 3 years.  I evaluated the patient's back today.  I do not see any atypical moles although she has several moles on her back.  She is not due for any immunizations at the present time.  Regarding her ADD, she states that she is doing well on Adderall 30 mg a day.  She denies any palpitations or chest pain or shortness of breath.  She denies any anxiety or insomnia.  She also states that she is doing well on the Wellbutrin.  She denies any uncontrolled depression or suicidal thoughts ?Past Medical History:  ?Diagnosis Date  ? 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 moderate atypia - left abdomen  ? 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 moderate atypia - right back  ? Mitral valve prolapse 2010  ? ?Past Surgical History:  ?Procedure Laterality Date  ? BUNIONECTOMY Right   ? ?Current Outpatient Medications on File Prior to Visit  ?Medication Sig Dispense Refill  ? amphetamine-dextroamphetamine (ADDERALL XR) 30 MG 24 hr capsule Take 1 capsule (30 mg total) by mouth every morning. 30 capsule 0  ? buPROPion (WELLBUTRIN XL) 300 MG 24 hr tablet Take 1 tablet (300 mg total) by mouth daily. 90 tablet 1  ? valACYclovir (VALTREX) 1000 MG tablet Take 1 tablet (1,000 mg total) by mouth 2 (two) times daily. (Patient not taking: Reported on  10/28/2020) 14 tablet 0  ? ?No current facility-administered medications on file prior to visit.  ? ?No Known Allergies ? ?Social History  ? ?Socioeconomic History  ? Marital status: Married  ?  Spouse name: Not on file  ? Number of children: Not on file  ? Years of education: Not on file  ? Highest education level: Not on file  ?Occupational History  ? Not on file  ?Tobacco Use  ? Smoking status: Former  ?  Types: Cigarettes  ? Smokeless tobacco: Never  ?Vaping Use  ? Vaping Use: Never used  ?Substance and Sexual Activity  ? Alcohol use: No  ?  Alcohol/week: 0.0 standard drinks  ? Drug use: No  ? Sexual activity: Yes  ?  Birth control/protection: None  ?  Comment: PATIENT'S HUSBAND WITH VASECTOMY..  1st intercourse- 14, partners- 3  ?Other Topics Concern  ? Not on file  ?Social History Narrative  ? Not on file  ? ?Social Determinants of Health  ? ?Financial Resource Strain: Not on file  ?Food Insecurity: Not on file  ?Transportation Needs: Not on file  ?Physical Activity: Not on file  ?Stress: Not on file  ?Social Connections: Not on file  ?Intimate Partner Violence: Not on file  ? ? ? ?Review of Systems  ?All other systems reviewed and are negative. ? ?   ?Objective:  ? Physical Exam ?Vitals reviewed.  ?Constitutional:   ?   Appearance: Normal appearance. She is normal weight.  ?Cardiovascular:  ?   Rate and Rhythm: Normal rate and regular rhythm.  ?   Pulses: Normal pulses.  ?   Heart sounds: Normal heart sounds. No murmur heard. ?  No friction rub. No gallop.  ?Pulmonary:  ?   Effort: Pulmonary effort is normal. No respiratory distress.  ?   Breath sounds: Normal breath sounds. No stridor. No wheezing or rales.  ?Abdominal:  ?   General: Abdomen is flat. Bowel sounds are normal.  ?   Palpations: Abdomen is soft.  ?Musculoskeletal:  ?   Right lower leg: No edema.  ?   Left lower leg: No edema.  ?Neurological:  ?   Mental Status: She is alert.  ? ? ? ? ?   ?Assessment & Plan:  ?Attention deficit disorder (ADD) without hyperactivity ?Continue Adderall XR 30 mg a day.  However, I strongly encouraged the patient to proceed to get a mammogram as soon as possible.  I also recommended a colonoscopy.  Pap smear is up-to-date.  Recommended that she follow-up with her dermatologist although her skin exam today is normal.  Recommended starting calcium and vitamin D as she is nearing menopause. ? ?

## 2021-05-25 ENCOUNTER — Other Ambulatory Visit: Payer: Self-pay | Admitting: Family Medicine

## 2021-05-26 MED ORDER — AMPHETAMINE-DEXTROAMPHET ER 30 MG PO CP24: 30.0000 mg | ORAL_CAPSULE | ORAL | 0 refills | Status: DC

## 2021-05-26 NOTE — Telephone Encounter (Signed)
LOV 04/29/21 ?Last refill 04/28/21, #30, 0 refills ? ?Please review, thanks! ? ?

## 2021-06-27 ENCOUNTER — Encounter: Payer: Self-pay | Admitting: Family Medicine

## 2021-06-27 ENCOUNTER — Other Ambulatory Visit: Payer: Self-pay | Admitting: Family Medicine

## 2021-06-27 MED ORDER — AMPHETAMINE-DEXTROAMPHET ER 30 MG PO CP24: 30.0000 mg | ORAL_CAPSULE | ORAL | 0 refills | Status: DC

## 2021-06-27 NOTE — Telephone Encounter (Signed)
LOV 04/29/21 ?Last refill 05/26/21, #30, 0 refills ? ?Please review, thanks! ? ?

## 2021-06-28 ENCOUNTER — Other Ambulatory Visit: Payer: Self-pay | Admitting: Family Medicine

## 2021-06-28 MED ORDER — BUPROPION HCL ER (XL) 300 MG PO TB24: 300.0000 mg | ORAL_TABLET | Freq: Every day | ORAL | 3 refills | Status: DC

## 2021-06-28 NOTE — Telephone Encounter (Signed)
Patient asking to restart Wellbutrin. Please advise, thanks! ?

## 2021-08-04 ENCOUNTER — Other Ambulatory Visit: Payer: Self-pay | Admitting: Family Medicine

## 2021-08-05 MED ORDER — AMPHETAMINE-DEXTROAMPHET ER 30 MG PO CP24: 30.0000 mg | ORAL_CAPSULE | ORAL | 0 refills | Status: DC

## 2021-08-05 NOTE — Telephone Encounter (Signed)
04/29/21 ov 06/27/21 last filled

## 2021-08-23 ENCOUNTER — Ambulatory Visit
Admission: RE | Admit: 2021-08-23 | Discharge: 2021-08-23 | Disposition: A | Discharge status: Home / Self Care | Payer: BC Managed Care – PPO | Source: Ambulatory Visit | Attending: Nurse Practitioner | Admitting: Nurse Practitioner

## 2021-08-23 ENCOUNTER — Ambulatory Visit (INDEPENDENT_AMBULATORY_CARE_PROVIDER_SITE_OTHER): Payer: BC Managed Care – PPO

## 2021-08-23 VITALS — BP 126/78 | HR 91 | Temp 99.4°F | Resp 16

## 2021-08-23 DIAGNOSIS — M79604 Pain in right leg: Secondary | ICD-10-CM | POA: Diagnosis not present

## 2021-08-23 DIAGNOSIS — S80811A Abrasion, right lower leg, initial encounter: Secondary | ICD-10-CM

## 2021-08-23 DIAGNOSIS — S81801A Unspecified open wound, right lower leg, initial encounter: Secondary | ICD-10-CM | POA: Diagnosis not present

## 2021-08-23 DIAGNOSIS — L03115 Cellulitis of right lower limb: Secondary | ICD-10-CM

## 2021-08-23 MED ORDER — CEPHALEXIN 500 MG PO CAPS: 500.0000 mg | ORAL_CAPSULE | Freq: Four times a day (QID) | ORAL | 0 refills | Status: AC

## 2021-08-23 MED ORDER — IBUPROFEN 800 MG PO TABS: 800.0000 mg | ORAL_TABLET | Freq: Three times a day (TID) | ORAL | 0 refills | Status: DC | PRN

## 2021-08-23 MED ORDER — FLUCONAZOLE 150 MG PO TABS: 150.0000 mg | ORAL_TABLET | Freq: Once | ORAL | 0 refills | Status: AC
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
## 2021-08-23 NOTE — Discharge Instructions (Addendum)
-   The x-ray today does not show any fracture of your lower leg bones - Please start the Keflex and take it every 6 hours for 10 days to treat the infection in your skin - You can use Tylenol [phone] mg every 6 hours for pain alternating with ibuprofen 800 mg every 8 hours for pain; also can try ice packs/cold compresses 15 minutes on 45 minutes off to help with pain/swelling - Please continue the great wound care and continue cleaning twice daily and apply a new dressing twice daily

## 2021-08-24 ENCOUNTER — Encounter: Payer: Self-pay | Admitting: Family Medicine

## 2021-08-25 ENCOUNTER — Other Ambulatory Visit: Payer: Self-pay

## 2021-08-25 ENCOUNTER — Other Ambulatory Visit: Payer: Self-pay | Admitting: Family Medicine

## 2021-08-25 ENCOUNTER — Ambulatory Visit
Admission: RE | Admit: 2021-08-25 | Discharge: 2021-08-25 | Disposition: A | Discharge status: Home / Self Care | Payer: BC Managed Care – PPO | Source: Ambulatory Visit | Attending: Family Medicine | Admitting: Family Medicine

## 2021-08-25 VITALS — BP 144/87 | HR 82 | Temp 98.3°F | Resp 18

## 2021-08-25 DIAGNOSIS — L03115 Cellulitis of right lower limb: Secondary | ICD-10-CM

## 2021-08-25 DIAGNOSIS — S80811D Abrasion, right lower leg, subsequent encounter: Secondary | ICD-10-CM | POA: Diagnosis not present

## 2021-08-25 DIAGNOSIS — S80811A Abrasion, right lower leg, initial encounter: Secondary | ICD-10-CM

## 2021-08-25 MED ORDER — CLINDAMYCIN HCL 300 MG PO CAPS: 300.0000 mg | ORAL_CAPSULE | Freq: Two times a day (BID) | ORAL | 0 refills | Status: DC

## 2021-08-25 MED ORDER — SULFAMETHOXAZOLE-TRIMETHOPRIM 800-160 MG PO TABS: 1.0000 | ORAL_TABLET | Freq: Two times a day (BID) | ORAL | 0 refills | Status: DC

## 2021-08-25 NOTE — ED Triage Notes (Addendum)
Pt reports seen for same on 7/4 and reports has been taking px medication and reports site is continuing to swell with drainage. Pt reports contacted pcp yesterday and sent over bactrim this am but reports has not picked that up yet.

## 2021-08-25 NOTE — ED Notes (Signed)
Bacitracin to site.Telfa pad applied. Secured with coban. Pt tolerated well.   Site management and infection prevention education provided. Pt verbalized understanding.

## 2021-08-25 NOTE — ED Provider Notes (Signed)
[facility]    CSN: [id number] Arrival date & time: 08/25/21  [place]      History   Chief Complaint Chief Complaint  Patient presents with   Leg Injury    Seen on 08/23/21. Was given keflex '500mg'$  4 x daily. Was worried about infection that day due to some redness. Showing more infection now even after 5 doses. More redness, swelling of entire lower R leg, yellow and clear drainage, more painful. - Entered by patient    HPI Deanna Calderon is a 46 y.o. female.   Presenting today following up on injury to the right shin for which she was seen 08/23/2021.  She was started on Keflex 4 times daily for which she is taking 2-1/2 days and states the area is becoming more swollen, draining and there is a hole forming toward the top of the wound.  She denies fever, chills, numbness, tingling, leg weakness.  Has been cleaning the area 3 times a day and keeping it covered with Neosporin.    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 Authorizing Provider  clindamycin (CLEOCIN) 300 MG capsule Take 1 capsule (300 mg total) by mouth 2 (two) times daily. 08/25/21  Yes Volney American, PA-C   amphetamine-dextroamphetamine (ADDERALL XR) 30 MG 24 hr capsule Take 1 capsule (30 mg total) by mouth every morning. 08/05/21   Susy Frizzle, MD  buPROPion (WELLBUTRIN XL) 300 MG 24 hr tablet Take 1 tablet (300 mg total) by mouth daily. 06/28/21   Susy Frizzle, MD  cephALEXin (KEFLEX) 500 MG capsule Take 1 capsule (500 mg total) by mouth every 6 (six) hours for 10 days. 08/23/21 09/02/21  Eulogio Bear, NP  ibuprofen (ADVIL) 800 MG tablet Take 1 tablet (800 mg total) by mouth every 8 (eight) hours as needed. Take with food to prevent GI upset 08/23/21   Eulogio Bear, NP  sulfamethoxazole-trimethoprim (BACTRIM DS) 800-160 MG tablet Take 1 tablet by mouth 2 (two) times daily. 08/25/21   Susy Frizzle, MD  valACYclovir (VALTREX) 1000 MG tablet Take 1 tablet (1,000 mg total) by mouth 2 (two) times daily. 04/27/20   Alycia Rossetti, MD    Family History Family History  Problem Relation Age of Onset   Heart disease Mother        Had MI 2012  COPD Maternal Grandfather     Social History Social History   Tobacco Use   Smoking status: Former    Types: Cigarettes   Smokeless tobacco: Never  Vaping Use   Vaping Use: Never used  Substance Use Topics   Alcohol use: No    Alcohol/week: 0.0 standard drinks of alcohol   Drug use: No     Allergies   Patient has no known allergies.   Review of Systems Review of Systems Per HPI  Physical Exam Triage Vital Signs ED Triage Vitals  Enc Vitals Group     BP 08/25/21 1653 (!) 144/87     Pulse Rate 08/25/21 1653 82     Resp 08/25/21 1653 18     Temp 08/25/21 1653 98.3 F (36.8 C)     Temp Source 08/25/21 1653 Oral     SpO2 08/25/21 1653 98 %     Weight --      Height --      Head Circumference --      Peak Flow --      Pain Score 08/25/21 1654 3     Pain Loc --      Pain Edu? --      Excl. in [place]? --    No data found.  Updated Vital Signs BP (!) 144/87 (BP Location: Right Arm)   Pulse 82   Temp 98.3 F (36.8  C) (Oral)   Resp 18   LMP 07/31/2021 (Approximate)   SpO2 98%   Visual Acuity Right Eye Distance:   Left Eye Distance:   Bilateral Distance:    Right Eye Near:   Left Eye Near:    Bilateral Near:     Physical Exam Vitals and nursing note reviewed.  Constitutional:      Appearance: Normal appearance. She is not ill-appearing.  HENT:     Head: Atraumatic.  Eyes:     Extraocular Movements: Extraocular movements intact.     Conjunctiva/sclera: Conjunctivae normal.  Cardiovascular:     Rate and Rhythm: Normal rate and regular rhythm.     Heart sounds: Normal heart sounds.  Pulmonary:     Effort: Pulmonary effort is normal.     Breath sounds: Normal breath sounds.  Musculoskeletal:        General: Normal range of motion.     Cervical back: Normal range of motion and neck supple.  Skin:    General: Skin is warm and dry.     Findings: Erythema present.     Comments: Long linear wound right shin with surrounding erythema, edema, clear yellow drainage and some ulceration superiorly  Neurological:     Mental Status: She is alert and oriented to person, place, and time.  Psychiatric:        Mood and Affect: Mood normal.        Thought Content: Thought content normal.        Judgment: Judgment normal.      UC Treatments / Results  Labs (all labs ordered are listed, but only abnormal results are displayed) Labs Reviewed - No data to display  EKG   Radiology No results found.  Procedures Procedures (including critical care time)  Medications Ordered in UC Medications - No data to display  Initial Impression / Assessment and Plan / UC Course  I have reviewed the triage vital signs and the nursing notes.  Pertinent labs & imaging results that were available during my care of the patient were reviewed by me  and considered in my medical decision making (see chart for details).     We will switch to clindamycin for further coverage, continue good home wound care,  keep covered.  Close PCP follow-up recommended for seen next week for recheck.  Return for worsening symptoms.  Final Clinical Impressions(s) / UC Diagnoses   Final diagnoses:  Abrasion of right lower extremity, initial encounter  Cellulitis of right lower extremity   Discharge Instructions   None    ED Prescriptions     Medication Sig Dispense Auth. Provider   clindamycin (CLEOCIN) 300 MG capsule Take 1 capsule (300 mg total) by mouth 2 (two) times daily. 14 capsule Volney American, [place]      PDMP not reviewed this encounter.   Volney American, [place] 08/25/21 [phone]

## 2021-09-09 ENCOUNTER — Other Ambulatory Visit: Payer: Self-pay | Admitting: Family Medicine

## 2021-09-09 MED ORDER — AMPHETAMINE-DEXTROAMPHET ER 30 MG PO CP24: 30.0000 mg | ORAL_CAPSULE | ORAL | 0 refills | Status: DC

## 2021-10-14 ENCOUNTER — Other Ambulatory Visit: Payer: Self-pay | Admitting: Family Medicine

## 2021-10-14 MED ORDER — AMPHETAMINE-DEXTROAMPHET ER 30 MG PO CP24: 30.0000 mg | ORAL_CAPSULE | ORAL | 0 refills | Status: DC

## 2021-10-31 ENCOUNTER — Other Ambulatory Visit: Payer: Self-pay | Admitting: Family Medicine

## 2021-10-31 ENCOUNTER — Ambulatory Visit: Payer: BC Managed Care – PPO | Admitting: Family Medicine

## 2021-10-31 VITALS — BP 120/70 | HR 82 | Temp 98.4°F | Ht 66.0 in | Wt 149.6 lb

## 2021-10-31 DIAGNOSIS — Z1211 Encounter for screening for malignant neoplasm of colon: Secondary | ICD-10-CM | POA: Diagnosis not present

## 2021-10-31 DIAGNOSIS — F988 Other specified behavioral and emotional disorders with onset usually occurring in childhood and adolescence: Secondary | ICD-10-CM | POA: Diagnosis not present

## 2021-10-31 DIAGNOSIS — Z1231 Encounter for screening mammogram for malignant neoplasm of breast: Secondary | ICD-10-CM

## 2021-10-31 MED ORDER — LISDEXAMFETAMINE DIMESYLATE 50 MG PO CAPS: 50.0000 mg | ORAL_CAPSULE | Freq: Every day | ORAL | 0 refills | Status: DC

## 2021-10-31 NOTE — Progress Notes (Signed)
Subjective:    Patient ID: Deanna Calderon, female    DOB: 01/17/1976, 46 y.o.   MRN: [id number]  HPI  Patient is a very pleasant 46 year old Caucasian female here today to establish care with me.  She has ADD.  She works as a Marine scientist.  Patient states that the Adderall XR is wearing off too quickly in the day.  She does not want to take an immediate release later in the day.  She is questioning if there may be a medicine that would last longer than Adderall XR and not wear off as quickly in the afternoon.  She is scheduled her mammogram and her Pap smear.  She is due for colon cancer screening.  She is also due for her flu shot but she defers this today.  She is also due for fasting lab work. 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 30 capsule 0   buPROPion (WELLBUTRIN XL) 300 MG 24 hr tablet Take 1 tablet (300 mg total) by mouth daily. 90 tablet 3   clindamycin (CLEOCIN) 300 MG capsule Take 1 capsule (300 mg total) by mouth 2 (two) times daily. 14 capsule 0   ibuprofen (ADVIL) 800 MG tablet Take 1 tablet (800 mg total) by mouth every 8 (eight) hours as needed. Take with food to prevent GI upset 30 tablet 0   sulfamethoxazole-trimethoprim (BACTRIM DS) 800-160 MG tablet Take 1 tablet by mouth 2 (two) times daily. 14 tablet 0   valACYclovir (VALTREX) 1000 MG tablet Take 1 tablet (1,000 mg total) by mouth 2 (two) times daily. 14 tablet 0   No current facility-administered medications on file prior to visit.   No Known Allergies  Social History   Socioeconomic  History   Marital status: Married    Spouse name: Not on file   Number of children: Not on file   Years of education: Not on file   Highest education level: Not on file  Occupational History   Not on file  Tobacco Use   Smoking status: Former    Types: Cigarettes   Smokeless tobacco: Never  Vaping Use   Vaping Use: Never used  Substance and Sexual Activity   Alcohol use: No    Alcohol/week: 0.0 standard drinks of alcohol   Drug use: No   Sexual activity: Yes    Birth control/protection: None    Comment: PATIENT'S HUSBAND WITH VASECTOMY.. 1st intercourse- 14, partners- 3  Other Topics Concern   Not on file  Social History Narrative   Not on file   Social Determinants of Health   Financial Resource Strain: Not on file  Food Insecurity: Not on file  Transportation Needs: Not on file  Physical Activity: Not on file  Stress: Not on file  Social Connections: Not on file  Intimate Partner Violence: Not on file     Review of Systems  All other systems reviewed and are negative.      Objective:   Physical Exam Vitals reviewed.  Constitutional:      Appearance: Normal appearance.  She is normal weight.  Cardiovascular:     Rate and Rhythm: Normal rate and regular rhythm.     Pulses: Normal pulses.     Heart sounds: Normal heart sounds. No murmur heard.    No friction rub. No gallop.  Pulmonary:     Effort: Pulmonary effort is normal. No respiratory distress.     Breath sounds: Normal breath sounds. No stridor. No wheezing or rales.  Abdominal:     General: Abdomen is flat. Bowel sounds are normal.     Palpations: Abdomen is soft.  Musculoskeletal:     Right lower leg: No edema.     Left lower leg: No edema.  Neurological:     Mental Status: She is alert.          Assessment & Plan:  Colon cancer screening - Plan: Cologuard  Attention deficit disorder (ADD) without hyperactivity We will try discontinuing Adderall XR and switching to Vyvanse 50 mg daily to  see if it will perform better for her due to its longer half-life.  I will also screen for colon cancer with Cologuard.  Patient will get her mammogram and her Pap smear at her gynecologist.  She declines a flu shot today.

## 2021-11-14 ENCOUNTER — Encounter: Payer: BC Managed Care – PPO | Admitting: Radiology

## 2021-11-18 DIAGNOSIS — Z1231 Encounter for screening mammogram for malignant neoplasm of breast: Secondary | ICD-10-CM

## 2021-12-13 DIAGNOSIS — Z13220 Encounter for screening for lipoid disorders: Secondary | ICD-10-CM | POA: Diagnosis not present

## 2021-12-14 ENCOUNTER — Other Ambulatory Visit: Payer: Self-pay | Admitting: Family Medicine

## 2021-12-15 MED ORDER — AMPHETAMINE-DEXTROAMPHET ER 30 MG PO CP24: 30.0000 mg | ORAL_CAPSULE | ORAL | 0 refills | Status: DC

## 2022-01-11 ENCOUNTER — Other Ambulatory Visit: Payer: Self-pay | Admitting: Family Medicine

## 2022-01-16 ENCOUNTER — Other Ambulatory Visit: Payer: Self-pay | Admitting: Family Medicine

## 2022-01-17 ENCOUNTER — Other Ambulatory Visit: Payer: Self-pay | Admitting: Family Medicine

## 2022-01-17 MED ORDER — ADDERALL XR 30 MG PO CP24
30.0000 mg | ORAL_CAPSULE | Freq: Every day | ORAL | 0 refills | Status: DC
Start: 2022-01-17 — End: 2022-02-16

## 2022-02-16 ENCOUNTER — Other Ambulatory Visit: Payer: Self-pay | Admitting: Family Medicine

## 2022-02-16 MED ORDER — ADDERALL XR 30 MG PO CP24: 30.0000 mg | ORAL_CAPSULE | Freq: Every day | ORAL | 0 refills | Status: DC

## 2022-02-21 ENCOUNTER — Encounter: Payer: Self-pay | Admitting: Family Medicine

## 2022-03-24 ENCOUNTER — Other Ambulatory Visit: Payer: Self-pay | Admitting: Family Medicine

## 2022-03-24 MED ORDER — ADDERALL XR 30 MG PO CP24: 30.0000 mg | ORAL_CAPSULE | Freq: Every day | ORAL | 0 refills | Status: DC

## 2022-04-17 ENCOUNTER — Other Ambulatory Visit (HOSPITAL_BASED_OUTPATIENT_CLINIC_OR_DEPARTMENT_OTHER): Payer: Self-pay | Admitting: Family Medicine

## 2022-04-17 DIAGNOSIS — Z1231 Encounter for screening mammogram for malignant neoplasm of breast: Secondary | ICD-10-CM

## 2022-04-18 ENCOUNTER — Encounter: Payer: BC Managed Care – PPO | Admitting: Obstetrics and Gynecology

## 2022-04-24 ENCOUNTER — Inpatient Hospital Stay (HOSPITAL_BASED_OUTPATIENT_CLINIC_OR_DEPARTMENT_OTHER): Admission: RE | Admit: 2022-04-24 | Payer: BC Managed Care – PPO | Source: Ambulatory Visit | Admitting: Radiology

## 2022-04-24 ENCOUNTER — Other Ambulatory Visit: Payer: Self-pay | Admitting: Family Medicine

## 2022-04-24 DIAGNOSIS — Z1231 Encounter for screening mammogram for malignant neoplasm of breast: Secondary | ICD-10-CM

## 2022-04-25 ENCOUNTER — Encounter: Payer: Self-pay | Admitting: Family Medicine

## 2022-04-25 ENCOUNTER — Other Ambulatory Visit: Payer: Self-pay | Admitting: Family Medicine

## 2022-04-25 MED ORDER — ADDERALL XR 30 MG PO CP24
30.0000 mg | ORAL_CAPSULE | Freq: Every day | ORAL | 0 refills | Status: DC
Start: 2022-04-25 — End: 2022-04-27

## 2022-04-25 MED ORDER — AMPHETAMINE-DEXTROAMPHET ER 30 MG PO CP24: 30.0000 mg | ORAL_CAPSULE | ORAL | 0 refills | Status: DC

## 2022-04-27 ENCOUNTER — Other Ambulatory Visit: Payer: Self-pay | Admitting: Family Medicine

## 2022-04-27 ENCOUNTER — Other Ambulatory Visit: Payer: Self-pay

## 2022-04-27 MED ORDER — AMPHETAMINE-DEXTROAMPHET ER 30 MG PO CP24: 30.0000 mg | ORAL_CAPSULE | ORAL | 0 refills | Status: DC

## 2022-04-27 NOTE — Telephone Encounter (Signed)
Received call from [place] at [facility] to request a new script for the generic version of Adderall; stated it's less expensive for the patient.   Please advise Beth with any questions at [phone].

## 2022-05-01 ENCOUNTER — Encounter: Payer: Self-pay | Admitting: Family Medicine

## 2022-05-01 ENCOUNTER — Ambulatory Visit: Payer: BC Managed Care – PPO | Admitting: Family Medicine

## 2022-05-01 VITALS — BP 114/72 | HR 76 | Temp 98.1°F | Ht 66.0 in | Wt 153.6 lb

## 2022-05-01 DIAGNOSIS — F988 Other specified behavioral and emotional disorders with onset usually occurring in childhood and adolescence: Secondary | ICD-10-CM | POA: Diagnosis not present

## 2022-05-01 DIAGNOSIS — E7800 Pure hypercholesterolemia, unspecified: Secondary | ICD-10-CM

## 2022-05-01 NOTE — Progress Notes (Signed)
Subjective:    Patient ID: Deanna Calderon, female    DOB: 12/20/1975, 47 y.o.   MRN: [id number]  HPI  Patient is a very pleasant 47 year old Caucasian female here today to recheck her cholesterol.  Last year in October we checked her cholesterol and an outside lab.  Her LDL cholesterol was mildly elevated at 144.  Since that time she has been taking fish oil.  She does have a concerning family history.  Her mother had a heart attack in her 30s.  However the patient states that she has other contributing risk factors including obesity.  Her mother weighed more than 300 pounds at the time of her heart attack.  The patient denies any chest pain or shortness of breath.  She is taking Adderall XR 30 mg p.o. every morning for ADD.  However she denies any angina or palpitations or shortness of breath.  She is here today to recheck her cholesterol.  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 30 capsule 0   buPROPion (WELLBUTRIN XL) 300 MG 24 hr tablet Take 1 tablet (300 mg total) by mouth daily. 90 tablet 3   valACYclovir (VALTREX) 1000 MG tablet Take 1 tablet (1,000 mg total) by mouth 2 (two) times daily. (Patient not taking: Reported on 10/31/2021) 14 tablet 0   No current facility-administered medications on file prior to visit.   No Known Allergies  Social History   Socioeconomic History   Marital status: Married    Spouse name: Not on file   Number of children: Not on file   Years of education: Not on file    Highest education level: Not on file  Occupational History   Not on file  Tobacco Use   Smoking status: Former    Types: Cigarettes   Smokeless tobacco: Never  Vaping Use   Vaping Use: Never used  Substance and Sexual Activity   Alcohol use: No    Alcohol/week: 0.0 standard drinks of alcohol   Drug use: No   Sexual activity: Yes    Birth control/protection: None    Comment: PATIENT'S HUSBAND WITH VASECTOMY.. 1st intercourse- 14, partners- 3  Other Topics Concern   Not on file  Social History Narrative   Not on file   Social Determinants of Health   Financial Resource Strain: Not on file  Food Insecurity: Not on file  Transportation Needs: Not on file  Physical Activity: Not on file  Stress: Not on file  Social Connections: Not on file  Intimate Partner Violence: Not on file     Review of Systems  All other systems reviewed and are negative.      Objective:   Physical Exam Vitals reviewed.  Constitutional:      Appearance: Normal appearance. She is normal weight.  Cardiovascular:     Rate and Rhythm: Normal rate and regular rhythm.     Pulses: Normal pulses.     Heart sounds: Normal heart sounds. No murmur heard.  No friction rub. No gallop.  Pulmonary:     Effort: Pulmonary effort is normal. No respiratory distress.     Breath sounds: Normal breath sounds. No stridor. No wheezing or rales.  Abdominal:     General: Abdomen is flat. Bowel sounds are normal.     Palpations: Abdomen is soft.  Musculoskeletal:     Right lower leg: No edema.     Left lower leg: No edema.  Neurological:     Mental Status: She is alert.          Assessment & Plan:  Attention deficit disorder (ADD) without hyperactivity  Pure hypercholesterolemia - Plan: Lipid panel, BASIC METABOLIC PANEL WITH GFR I am concerned by her family history given premature coronary artery disease and a female relative.  If her LDL cholesterol is still elevated, we may consider starting the  patient on statin.  I discussed this.  She is hesitant to take a statin.  We also discussed coronary artery calcium score.  The patient would like to get a coronary artery calcium score before starting a statin to determine if she truly needs this.  Her mammogram and her Pap smear are performed by her gynecologist.  She realizes she is due for colon cancer screening.  She has a Cologuard but she has not performed the test yet

## 2022-05-02 LAB — LIPID PANEL
Cholesterol: 222 mg/dL — ABNORMAL HIGH (ref <200)
HDL: 65 mg/dL (ref >=50)
LDL Cholesterol (Calc): 140 mg/dL (calc) — ABNORMAL HIGH
Non-HDL Cholesterol (Calc): 157 mg/dL (calc) — ABNORMAL HIGH (ref <130)
Total CHOL/HDL Ratio: 3.4 (calc) (ref <5.0)
Triglycerides: 75 mg/dL (ref <150)

## 2022-05-02 LAB — BASIC METABOLIC PANEL WITH GFR
BUN: 10 mg/dL (ref 7–25)
CO2: 27 mmol/L (ref 20–32)
Calcium: 9.2 mg/dL (ref 8.6–10.2)
Chloride: 106 mmol/L (ref 98–110)
Creat: 0.79 mg/dL (ref 0.50–0.99)
Glucose, Bld: 87 mg/dL (ref 65–99)
Potassium: 4.2 mmol/L (ref 3.5–5.3)
Sodium: 140 mmol/L (ref 135–146)
eGFR: 92 mL/min/{1.73_m2} (ref >=60)

## 2022-05-18 ENCOUNTER — Encounter: Payer: BC Managed Care – PPO | Admitting: Radiology

## 2022-05-26 ENCOUNTER — Other Ambulatory Visit: Payer: Self-pay | Admitting: Family Medicine

## 2022-05-26 MED ORDER — AMPHETAMINE-DEXTROAMPHET ER 30 MG PO CP24
30.0000 mg | ORAL_CAPSULE | ORAL | 0 refills | Status: DC
Start: 2022-05-26 — End: 2022-06-24

## 2022-06-24 ENCOUNTER — Other Ambulatory Visit: Payer: Self-pay | Admitting: Family Medicine

## 2022-06-26 MED ORDER — AMPHETAMINE-DEXTROAMPHET ER 30 MG PO CP24
30.0000 mg | ORAL_CAPSULE | ORAL | 0 refills | Status: DC
Start: 2022-06-26 — End: 2022-07-26

## 2022-07-26 ENCOUNTER — Other Ambulatory Visit: Payer: Self-pay | Admitting: Family Medicine

## 2022-07-26 MED ORDER — AMPHETAMINE-DEXTROAMPHET ER 30 MG PO CP24
30.0000 mg | ORAL_CAPSULE | ORAL | 0 refills | Status: DC
Start: 2022-07-26 — End: 2022-08-30

## 2022-08-30 ENCOUNTER — Other Ambulatory Visit: Payer: Self-pay | Admitting: Family Medicine

## 2022-08-30 ENCOUNTER — Encounter: Payer: Self-pay | Admitting: Family Medicine

## 2022-08-30 ENCOUNTER — Other Ambulatory Visit: Payer: Self-pay

## 2022-08-30 DIAGNOSIS — F988 Other specified behavioral and emotional disorders with onset usually occurring in childhood and adolescence: Secondary | ICD-10-CM

## 2022-08-30 DIAGNOSIS — F419 Anxiety disorder, unspecified: Secondary | ICD-10-CM

## 2022-08-30 MED ORDER — BUPROPION HCL ER (XL) 300 MG PO TB24: 300.0000 mg | ORAL_TABLET | Freq: Every day | ORAL | 1 refills | Status: AC

## 2022-08-31 MED ORDER — AMPHETAMINE-DEXTROAMPHET ER 30 MG PO CP24
30.0000 mg | ORAL_CAPSULE | ORAL | 0 refills | Status: DC
Start: 2022-08-31 — End: 2022-09-26

## 2022-09-26 ENCOUNTER — Other Ambulatory Visit: Payer: Self-pay | Admitting: Family Medicine

## 2022-10-02 MED ORDER — AMPHETAMINE-DEXTROAMPHET ER 30 MG PO CP24
30.0000 mg | ORAL_CAPSULE | ORAL | 0 refills | Status: DC
Start: 2022-10-02 — End: 2022-11-02

## 2022-11-02 ENCOUNTER — Other Ambulatory Visit: Payer: Self-pay | Admitting: Family Medicine

## 2022-11-06 MED ORDER — AMPHETAMINE-DEXTROAMPHET ER 30 MG PO CP24: 30.0000 mg | ORAL_CAPSULE | ORAL | 0 refills | Status: DC

## 2022-11-20 ENCOUNTER — Encounter: Payer: Self-pay | Admitting: Family Medicine

## 2022-11-20 ENCOUNTER — Ambulatory Visit: Payer: BC Managed Care – PPO | Admitting: Family Medicine

## 2022-11-20 VITALS — BP 120/62 | HR 65 | Temp 97.9°F | Ht 66.0 in | Wt 151.0 lb

## 2022-11-20 DIAGNOSIS — E7800 Pure hypercholesterolemia, unspecified: Secondary | ICD-10-CM

## 2022-11-20 DIAGNOSIS — Z1211 Encounter for screening for malignant neoplasm of colon: Secondary | ICD-10-CM

## 2022-11-20 DIAGNOSIS — F988 Other specified behavioral and emotional disorders with onset usually occurring in childhood and adolescence: Secondary | ICD-10-CM

## 2022-11-20 DIAGNOSIS — Z1231 Encounter for screening mammogram for malignant neoplasm of breast: Secondary | ICD-10-CM

## 2022-11-20 MED ORDER — AMPHETAMINE-DEXTROAMPHET ER 30 MG PO CP24
30.0000 mg | ORAL_CAPSULE | ORAL | 0 refills | Status: DC
Start: 2022-11-20 — End: 2022-11-21

## 2022-11-20 NOTE — Progress Notes (Signed)
Subjective:    Patient ID: Deanna Calderon, female    DOB: 19-Jun-1975, 47 y.o.   MRN: [id number]  HPI  Patient is a very pleasant 47 year old Caucasian female here today to recheck her ADHD.  She is taking Adderall XR 30 mg p.o. every morning for ADD.  However she denies any angina or palpitations or shortness of breath.  She feels like the Adderall wears off too quickly sometimes.  However, she has no desire to increase the medication or make a change at this point.  Labs in March showed LDL of 140 and total cholesterol of 222.  Patient is due for colon cancer screening.  We discussed her options and she prefers Cologuard.  She is also overdue for a mammogram.  She would like me to schedule this for her.  She is due for a flu shot but she politely declines this.  She states that she is taking fish oil at 1000 mg most days.  Half the time she takes 2000 mg.  She denies any side effects. 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 30 capsule 0   buPROPion (WELLBUTRIN XL) 300 MG 24 hr tablet Take 1 tablet (300 mg total) by mouth daily. 90 tablet 1   valACYclovir (VALTREX) 1000 MG tablet Take 1 tablet (1,000 mg total) by mouth 2 (two) times daily. (Patient not taking: Reported on 10/31/2021) 14 tablet 0   No current facility-administered medications on file prior to visit.   No Known Allergies  Social History   Socioeconomic History   Marital status: Married    Spouse name: Not on file   Number of children: Not on file   Years of education: Not on file   Highest  education level: Not on file  Occupational History   Not on file  Tobacco Use   Smoking status: Former    Types: Cigarettes   Smokeless tobacco: Never  Vaping Use   Vaping status: Never Used  Substance and Sexual Activity   Alcohol use: No    Alcohol/week: 0.0 standard drinks of alcohol   Drug use: No   Sexual activity: Yes    Birth control/protection: None    Comment: PATIENT'S HUSBAND WITH VASECTOMY.. 1st intercourse- 14, partners- 3  Other Topics Concern   Not on file  Social History Narrative   Not on file   Social Determinants of Health   Financial Resource Strain: Not on file  Food Insecurity: Not on file  Transportation Needs: Not on file  Physical Activity: Not on file  Stress: Not on file  Social Connections: Not on file  Intimate Partner Violence: Not on file     Review of Systems  All other systems reviewed and are negative.      Objective:   Physical Exam Vitals reviewed.  Constitutional:      Appearance: Normal appearance. She is normal weight.  Cardiovascular:     Rate and Rhythm: Normal rate and regular rhythm.     Pulses: Normal pulses.     Heart sounds: Normal  heart sounds. No murmur heard.    No friction rub. No gallop.  Pulmonary:     Effort: Pulmonary effort is normal. No respiratory distress.     Breath sounds: Normal breath sounds. No stridor. No wheezing or rales.  Abdominal:     General: Abdomen is flat. Bowel sounds are normal.     Palpations: Abdomen is soft.  Musculoskeletal:     Right lower leg: No edema.     Left lower leg: No edema.  Neurological:     Mental Status: She is alert.          Assessment & Plan:  Attention deficit disorder (ADD) without hyperactivity  Pure hypercholesterolemia - Plan: COMPLETE METABOLIC PANEL WITH GFR, Lipid panel  Colon cancer screening - Plan: Cologuard  Encounter for screening mammogram for malignant neoplasm of breast - Plan: MM Digital Screening Patient has a history of coronary  artery disease in her mother.  I will recheck her cholesterol.  If not better I would recommend a statin.  Patient is very hesitant to take a statin.  She therefore would like to proceed with a coronary artery calcium score to determine if she has risk factors prior to starting statin.  Therefore if her cholesterol is still elevated, we will obtain a cardiac CT.  I will schedule the patient for mammogram.  I will schedule the patient for Cologuard.  We will continue Adderall at his present dose.

## 2022-11-21 ENCOUNTER — Other Ambulatory Visit: Payer: Self-pay | Admitting: Family Medicine

## 2022-11-21 LAB — COMPLETE METABOLIC PANEL WITH GFR
AG Ratio: 1.8 (calc) (ref 1.0–2.5)
ALT: 10 U/L (ref 6–29)
AST: 14 U/L (ref 10–35)
Albumin: 3.9 g/dL (ref 3.6–5.1)
Alkaline phosphatase (APISO): 64 U/L (ref 31–125)
BUN: 11 mg/dL (ref 7–25)
CO2: 25 mmol/L (ref 20–32)
Calcium: 8.9 mg/dL (ref 8.6–10.2)
Chloride: 104 mmol/L (ref 98–110)
Creat: 0.86 mg/dL (ref 0.50–0.99)
Globulin: 2.2 g/dL (ref 1.9–3.7)
Glucose, Bld: 92 mg/dL (ref 65–99)
Potassium: 4 mmol/L (ref 3.5–5.3)
Sodium: 137 mmol/L (ref 135–146)
Total Bilirubin: 0.4 mg/dL (ref 0.2–1.2)
Total Protein: 6.1 g/dL (ref 6.1–8.1)
eGFR: 83 mL/min/{1.73_m2} (ref >=60)

## 2022-11-21 LAB — LIPID PANEL
Cholesterol: 183 mg/dL (ref <200)
HDL: 50 mg/dL (ref >=50)
LDL Cholesterol (Calc): 111 mg/dL — ABNORMAL HIGH
Non-HDL Cholesterol (Calc): 133 mg/dL — ABNORMAL HIGH (ref <130)
Total CHOL/HDL Ratio: 3.7 (calc) (ref <5.0)
Triglycerides: 108 mg/dL (ref <150)

## 2022-11-21 MED ORDER — LISDEXAMFETAMINE DIMESYLATE 50 MG PO CAPS: 50.0000 mg | ORAL_CAPSULE | Freq: Every day | ORAL | 0 refills | Status: DC

## 2022-12-20 ENCOUNTER — Ambulatory Visit (INDEPENDENT_AMBULATORY_CARE_PROVIDER_SITE_OTHER): Payer: BC Managed Care – PPO

## 2022-12-20 DIAGNOSIS — Z23 Encounter for immunization: Secondary | ICD-10-CM | POA: Diagnosis not present

## 2022-12-20 NOTE — Progress Notes (Signed)
Pt came in for flu vaccine. Injection given on the L-upper arm. Pt tol injection well w/no c/o. Pt left ambulatory w/no c/o

## 2023-01-01 ENCOUNTER — Other Ambulatory Visit: Payer: Self-pay | Admitting: Family Medicine

## 2023-01-03 ENCOUNTER — Encounter: Payer: Self-pay | Admitting: Family Medicine

## 2023-01-04 ENCOUNTER — Other Ambulatory Visit: Payer: Self-pay | Admitting: Family Medicine

## 2023-01-04 MED ORDER — AMPHETAMINE-DEXTROAMPHET ER 30 MG PO CP24: 30.0000 mg | ORAL_CAPSULE | ORAL | 0 refills | Status: DC

## 2023-01-10 NOTE — Telephone Encounter (Signed)
Pharmacy sent script to follow up on refill requested for lisdexamfetamine (VYVANSE) 50 MG capsule [[id number]]  DISCONTINUED   Pharmacy:   [facility] - [place], [place] - [address] [place], [place] [address] Phone: [phone]  Fax: [phone]   Please advise pharmacist.

## 2023-01-25 ENCOUNTER — Other Ambulatory Visit: Payer: Self-pay | Admitting: Family Medicine

## 2023-01-26 NOTE — Telephone Encounter (Signed)
Requested medication (s) are due for refill today -no  Requested medication (s) are on the active medication list -no  Future visit scheduled -no  Last refill: no longer listed on current medication list  Notes to clinic: non delegated Rx  Requested Prescriptions  Pending Prescriptions Disp Refills   VYVANSE 50 MG capsule [Pharmacy Med Name: Vyvanse 50 mg capsule] 30 capsule 0    Sig: TAKE ONE CAPSULE BY MOUTH EVERY DAY     Not Delegated - Psychiatry:  Stimulants/ADHD Failed - 01/25/2023  9:44 AM      Failed - This refill cannot be delegated      Failed - Urine Drug Screen completed in last 360 days      Failed - Valid encounter within last 6 months    Recent Outpatient Visits           1 year ago Attention deficit disorder (ADD) without hyperactivity   [facility] [place], Priscille Heidelberg, MD   2 years ago Attention deficit disorder (ADD) without hyperactivity   [facility] [place], Priscille Heidelberg, MD   2 years ago Routine general medical examination at a health care facility   [facility] [place], Velna [place], MD   3 years ago Attention deficit disorder (ADD) without hyperactivity   [facility] [place], Velna [place]chet, MD   4 years ago Mild episode of recurrent major depressive disorder (HCC)   [facility] [place], Velna [place], MD              Passed - Last BP in normal range    BP Readings from Last 1 Encounters:  11/20/22 120/62         Passed - Last Heart Rate in normal range    Pulse Readings from Last 1 Encounters:  11/20/22 65            Requested Prescriptions  Pending Prescriptions Disp Refills   VYVANSE 50 MG capsule [Pharmacy Med Name: Vyvanse 50 mg capsule] 30 capsule 0    Sig: TAKE ONE CAPSULE BY MOUTH EVERY DAY     Not Delegated - Psychiatry:  Stimulants/ADHD Failed - 01/25/2023  9:44 AM      Failed - This refill cannot be delegated      Failed - Urine Drug Screen completed in last  360 days      Failed - Valid encounter within last 6 months    Recent Outpatient Visits           1 year ago Attention deficit disorder (ADD) without hyperactivity   [facility] [place], Priscille Heidelberg, MD   2 years ago Attention deficit disorder (ADD) without hyperactivity   [facility] [place], Priscille Heidelberg, MD   2 years ago Routine general medical examination at a health care facility   [facility] [place], Velna [place], MD   3 years ago Attention deficit disorder (ADD) without hyperactivity   [facility] [place], Velna Hatchet, MD   4 years ago Mild episode of recurrent major depressive disorder (HCC)   [facility] [place], Velna [place]chet, MD              Passed - Last BP in normal range    BP Readings from Last 1 Encounters:  11/20/22 120/62         Passed - Last Heart Rate in normal range    Pulse Readings from Last 1 Encounters:  11/20/22 65            

## 2023-01-29 MED ORDER — LISDEXAMFETAMINE DIMESYLATE 50 MG PO CAPS: 50.0000 mg | ORAL_CAPSULE | Freq: Every day | ORAL | 0 refills | Status: DC

## 2023-02-01 ENCOUNTER — Other Ambulatory Visit: Payer: Self-pay | Admitting: Family Medicine

## 2023-02-01 MED ORDER — AMPHETAMINE-DEXTROAMPHET ER 30 MG PO CP24
30.0000 mg | ORAL_CAPSULE | ORAL | 0 refills | Status: DC
Start: 2023-02-01 — End: 2023-03-04

## 2023-03-04 ENCOUNTER — Other Ambulatory Visit: Payer: Self-pay | Admitting: Family Medicine

## 2023-03-06 MED ORDER — AMPHETAMINE-DEXTROAMPHET ER 30 MG PO CP24
30.0000 mg | ORAL_CAPSULE | ORAL | 0 refills | Status: DC
Start: 2023-03-06 — End: 2023-04-05

## 2023-04-05 ENCOUNTER — Other Ambulatory Visit: Payer: Self-pay | Admitting: Family Medicine

## 2023-04-05 MED ORDER — AMPHETAMINE-DEXTROAMPHET ER 30 MG PO CP24: 30.0000 mg | ORAL_CAPSULE | ORAL | 0 refills | Status: DC

## 2023-05-15 ENCOUNTER — Other Ambulatory Visit: Payer: Self-pay | Admitting: Family Medicine

## 2023-05-15 MED ORDER — AMPHETAMINE-DEXTROAMPHET ER 30 MG PO CP24: 30.0000 mg | ORAL_CAPSULE | ORAL | 0 refills | Status: DC

## 2023-05-21 ENCOUNTER — Ambulatory Visit: Payer: BC Managed Care – PPO | Admitting: Family Medicine

## 2023-06-18 ENCOUNTER — Encounter: Payer: Self-pay | Admitting: Family Medicine

## 2023-06-18 ENCOUNTER — Ambulatory Visit: Admitting: Family Medicine

## 2023-06-18 VITALS — BP 124/76 | HR 75 | Temp 98.2°F | Ht 66.0 in | Wt 152.0 lb

## 2023-06-18 DIAGNOSIS — E7800 Pure hypercholesterolemia, unspecified: Secondary | ICD-10-CM | POA: Diagnosis not present

## 2023-06-18 NOTE — Progress Notes (Signed)
 Subjective:    Patient ID: Deanna Calderon, female    DOB: [birth year], 48 y.o.   MRN: [id number]  HPI  Patient is a very pleasant 48 year old Caucasian female here today to recheck her ADHD.  She is taking Adderall  XR 30 mg p.o. every morning for ADD.  However she denies any angina or palpitations or shortness of breath.  Patient is due to recheck her cholesterol.  She has hyperlipidemia we will recheck last spring.  She was able to lower it through a combination of exercise and fish oil.  She admits that she has not been taking the fish oil as regularly.  She is not on calcium or vitamin D.  She denies any irregular periods or hot flashes. 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 30 capsule 0   buPROPion  (WELLBUTRIN  XL) 300 MG 24 hr tablet Take 1 tablet (300 mg total) by mouth daily. 90 tablet 1   Omega-3 Fatty Acids (FISH OIL) 500 MG CAPS Take by mouth.     No current facility-administered medications on file prior to visit.   No Known Allergies  Social History   Socioeconomic History   Marital status: Married    Spouse name: Not on file   Number of children: Not on file   Years of education: Not on file   Highest education level: Not on file  Occupational History   Not on file  Tobacco Use   Smoking status: Former    Types: Cigarettes   Smokeless tobacco: Never  Vaping Use   Vaping status: Never Used  Substance and Sexual Activity   Alcohol use: No    Alcohol/week: 0.0 standard drinks of alcohol   Drug use: No   Sexual activity: Yes    Birth  control/protection: None    Comment: PATIENT'S HUSBAND WITH VASECTOMY.. 1st intercourse- 14, partners- 3  Other Topics Concern   Not on file  Social History Narrative   Not on file   Social Drivers of Health   Financial Resource Strain: Not on file  Food Insecurity: Not on file  Transportation Needs: Not on file  Physical Activity: Not on file  Stress: Not on file  Social Connections: Not on file  Intimate Partner Violence: Not on file     Review of Systems  All other systems reviewed and are negative.      Objective:   Physical Exam Vitals reviewed.  Constitutional:      Appearance: Normal appearance. She is normal weight.  Cardiovascular:     Rate and Rhythm: Normal rate and regular rhythm.     Pulses: Normal pulses.     Heart sounds: Normal heart sounds. No murmur heard.    No friction rub. No gallop.  Pulmonary:     Effort: Pulmonary effort is normal. No respiratory distress.     Breath sounds: Normal breath sounds. No stridor. No wheezing or rales.  Abdominal:     General: Abdomen is flat. Bowel sounds are normal.     Palpations: Abdomen is soft.  Musculoskeletal:  Right lower leg: No edema.     Left lower leg: No edema.  Neurological:     Mental Status: She is alert.          Assessment & Plan:  Pure hypercholesterolemia - Plan: COMPLETE METABOLIC PANEL WITHOUT GFR, Lipid panel Patient has a history of coronary artery disease in her mother.  I would like to see her LDL cholesterol less than 161 and is close to 100 as possible.  Check CMP and fasting lipid panel.  Continue Adderall  at its current dose.  Seems to be working well for the patient.  Recommended calcium and vitamin D for prevention of osteoporosis.

## 2023-06-19 LAB — LIPID PANEL
Cholesterol: 185 mg/dL (ref <200)
HDL: 60 mg/dL (ref >=50)
LDL Cholesterol (Calc): 111 mg/dL — ABNORMAL HIGH
Non-HDL Cholesterol (Calc): 125 mg/dL (ref <130)
Total CHOL/HDL Ratio: 3.1 (calc) (ref <5.0)
Triglycerides: 54 mg/dL (ref <150)

## 2023-06-19 LAB — COMPLETE METABOLIC PANEL WITHOUT GFR
AG Ratio: 1.9 (calc) (ref 1.0–2.5)
ALT: 10 U/L (ref 6–29)
AST: 14 U/L (ref 10–35)
Albumin: 4.1 g/dL (ref 3.6–5.1)
Alkaline phosphatase (APISO): 57 U/L (ref 31–125)
BUN: 14 mg/dL (ref 7–25)
CO2: 26 mmol/L (ref 20–32)
Calcium: 9.1 mg/dL (ref 8.6–10.2)
Chloride: 106 mmol/L (ref 98–110)
Creat: 0.78 mg/dL (ref 0.50–0.99)
Globulin: 2.2 g/dL (ref 1.9–3.7)
Glucose, Bld: 91 mg/dL (ref 65–99)
Potassium: 3.8 mmol/L (ref 3.5–5.3)
Sodium: 139 mmol/L (ref 135–146)
Total Bilirubin: 0.4 mg/dL (ref 0.2–1.2)
Total Protein: 6.3 g/dL (ref 6.1–8.1)

## 2023-06-22 ENCOUNTER — Other Ambulatory Visit: Payer: Self-pay | Admitting: Family Medicine

## 2023-06-22 MED ORDER — AMPHETAMINE-DEXTROAMPHET ER 30 MG PO CP24
30.0000 mg | ORAL_CAPSULE | ORAL | 0 refills | Status: DC
Start: 2023-06-22 — End: 2023-07-31

## 2023-07-31 ENCOUNTER — Other Ambulatory Visit: Payer: Self-pay

## 2023-07-31 ENCOUNTER — Encounter: Payer: Self-pay | Admitting: Family Medicine

## 2023-07-31 MED ORDER — AMPHETAMINE-DEXTROAMPHET ER 30 MG PO CP24: 30.0000 mg | ORAL_CAPSULE | ORAL | 0 refills | Status: DC

## 2023-08-30 ENCOUNTER — Encounter: Payer: Self-pay | Admitting: Family Medicine

## 2023-08-31 ENCOUNTER — Other Ambulatory Visit: Payer: Self-pay

## 2023-08-31 MED ORDER — AMPHETAMINE-DEXTROAMPHET ER 30 MG PO CP24: 30.0000 mg | ORAL_CAPSULE | ORAL | 0 refills | Status: DC

## 2023-10-03 ENCOUNTER — Encounter: Payer: Self-pay | Admitting: Family Medicine

## 2023-10-04 ENCOUNTER — Other Ambulatory Visit: Payer: Self-pay | Admitting: Family Medicine

## 2023-10-04 MED ORDER — AMPHETAMINE-DEXTROAMPHET ER 30 MG PO CP24: 30.0000 mg | ORAL_CAPSULE | ORAL | 0 refills | Status: DC

## 2023-10-15 ENCOUNTER — Telehealth: Payer: Self-pay | Admitting: Pharmacy Technician

## 2023-10-15 ENCOUNTER — Other Ambulatory Visit (HOSPITAL_COMMUNITY): Payer: Self-pay

## 2023-10-15 NOTE — Telephone Encounter (Signed)
 Pharmacy Patient Advocate Encounter   Received notification from Onbase that prior authorization for Amphetamine -Dextroamphet ER 30MG  er capsules  is required/requested.   Insurance verification completed.   The patient is insured through [employer] [facility] .   Per test claim: PA required and submitted KEY/EOC/Request #: [id number] from 10/15/23 to 10/12/24. Ran test claim, Copay is $104.95. This test claim was processed through [facility]- copay amounts may vary at other pharmacies due to pharmacy/plan contracts, or as the patient moves through the different stages of their insurance plan.

## 2023-11-08 ENCOUNTER — Encounter: Payer: Self-pay | Admitting: Family Medicine

## 2023-11-08 ENCOUNTER — Other Ambulatory Visit: Payer: Self-pay

## 2023-11-08 MED ORDER — AMPHETAMINE-DEXTROAMPHET ER 30 MG PO CP24: 30.0000 mg | ORAL_CAPSULE | ORAL | 0 refills | Status: DC

## 2023-12-10 ENCOUNTER — Encounter: Payer: Self-pay | Admitting: Family Medicine

## 2023-12-10 ENCOUNTER — Other Ambulatory Visit: Payer: Self-pay

## 2023-12-10 MED ORDER — AMPHETAMINE-DEXTROAMPHET ER 30 MG PO CP24: 30.0000 mg | ORAL_CAPSULE | ORAL | 0 refills | Status: DC

## 2024-01-09 ENCOUNTER — Encounter: Payer: Self-pay | Admitting: Family Medicine

## 2024-01-11 ENCOUNTER — Other Ambulatory Visit: Payer: Self-pay | Admitting: Family Medicine

## 2024-01-11 MED ORDER — AMPHETAMINE-DEXTROAMPHET ER 30 MG PO CP24: 30.0000 mg | ORAL_CAPSULE | ORAL | 0 refills | Status: DC

## 2024-02-11 ENCOUNTER — Other Ambulatory Visit: Payer: Self-pay

## 2024-02-11 ENCOUNTER — Encounter: Payer: Self-pay | Admitting: Family Medicine

## 2024-02-11 MED ORDER — AMPHETAMINE-DEXTROAMPHET ER 30 MG PO CP24: 30.0000 mg | ORAL_CAPSULE | ORAL | 0 refills | Status: AC

## 2024-02-19 ENCOUNTER — Ambulatory Visit: Admitting: Family Medicine

## 2024-02-26 ENCOUNTER — Ambulatory Visit: Admitting: Family Medicine

## 2024-03-06 ENCOUNTER — Ambulatory Visit: Admitting: Family Medicine
